# Patient Record
Sex: Male | Born: 1985
Health system: Southern US, Community
[De-identification: ages and names within clinical notes are randomized; demographics above are authoritative.]

## PROBLEM LIST (undated history)

## (undated) DIAGNOSIS — K589 Irritable bowel syndrome without diarrhea: Secondary | ICD-10-CM

## (undated) HISTORY — DX: Essential (primary) hypertension: I10

---

## 2014-11-06 ENCOUNTER — Encounter (HOSPITAL_COMMUNITY): Payer: Self-pay

## 2014-11-06 ENCOUNTER — Emergency Department (HOSPITAL_COMMUNITY)
Admission: EM | Admit: 2014-11-06 | Discharge: 2014-11-06 | Disposition: A | Discharge status: Home / Self Care | Payer: 59 | Attending: Physician Assistant | Admitting: Physician Assistant

## 2014-11-06 ENCOUNTER — Emergency Department (HOSPITAL_COMMUNITY): Payer: 59

## 2014-11-06 DIAGNOSIS — Z8719 Personal history of other diseases of the digestive system: Secondary | ICD-10-CM | POA: Diagnosis not present

## 2014-11-06 DIAGNOSIS — R110 Nausea: Secondary | ICD-10-CM | POA: Diagnosis not present

## 2014-11-06 DIAGNOSIS — Z87891 Personal history of nicotine dependence: Secondary | ICD-10-CM | POA: Insufficient documentation

## 2014-11-06 DIAGNOSIS — R1013 Epigastric pain: Secondary | ICD-10-CM | POA: Diagnosis present

## 2014-11-06 DIAGNOSIS — R1011 Right upper quadrant pain: Secondary | ICD-10-CM | POA: Insufficient documentation

## 2014-11-06 DIAGNOSIS — Z79899 Other long term (current) drug therapy: Secondary | ICD-10-CM | POA: Insufficient documentation

## 2014-11-06 HISTORY — DX: Irritable bowel syndrome, unspecified: K58.9

## 2014-11-06 LAB — COMPREHENSIVE METABOLIC PANEL
ALT: 19 U/L (ref 17–63)
ANION GAP: 8 (ref 5–15)
AST: 17 U/L (ref 15–41)
Albumin: 5 g/dL (ref 3.5–5.0)
Alkaline Phosphatase: 71 U/L (ref 38–126)
BUN: 14 mg/dL (ref 6–20)
CHLORIDE: 107 mmol/L (ref 101–111)
CO2: 27 mmol/L (ref 22–32)
Calcium: 9.6 mg/dL (ref 8.9–10.3)
Creatinine, Ser: 0.89 mg/dL (ref 0.61–1.24)
GFR calc Af Amer: >60 mL/min (ref >60)
Glucose, Bld: 97 mg/dL (ref 65–99)
POTASSIUM: 4 mmol/L (ref 3.5–5.1)
Sodium: 142 mmol/L (ref 135–145)
Total Bilirubin: 1.2 mg/dL (ref 0.3–1.2)
Total Protein: 7.7 g/dL (ref 6.5–8.1)

## 2014-11-06 LAB — URINALYSIS, ROUTINE W REFLEX MICROSCOPIC
Bilirubin Urine: NEGATIVE
Glucose, UA: NEGATIVE mg/dL
Hgb urine dipstick: NEGATIVE
Ketones, ur: NEGATIVE mg/dL
LEUKOCYTES UA: NEGATIVE
NITRITE: NEGATIVE
PH: 8 (ref 5.0–8.0)
Protein, ur: NEGATIVE mg/dL
SPECIFIC GRAVITY, URINE: 1.023 (ref 1.005–1.030)
Urobilinogen, UA: 0.2 mg/dL (ref 0.0–1.0)

## 2014-11-06 LAB — CBC
HEMATOCRIT: 38.7 % — AB (ref 39.0–52.0)
HEMOGLOBIN: 13.2 g/dL (ref 13.0–17.0)
MCH: 29.9 pg (ref 26.0–34.0)
MCHC: 34.1 g/dL (ref 30.0–36.0)
MCV: 87.6 fL (ref 78.0–100.0)
Platelets: 243 10*3/uL (ref 150–400)
RBC: 4.42 MIL/uL (ref 4.22–5.81)
RDW: 12.6 % (ref 11.5–15.5)
WBC: 5.8 10*3/uL (ref 4.0–10.5)

## 2014-11-06 LAB — LIPASE, BLOOD: LIPASE: 23 U/L (ref 22–51)

## 2014-11-06 MED ORDER — PANTOPRAZOLE SODIUM 40 MG IV SOLR
40.0000 mg | INTRAVENOUS | Status: AC
  Administered 2014-11-06: 40 mg via INTRAVENOUS
  Filled 2014-11-06: qty 40

## 2014-11-06 MED ORDER — ONDANSETRON HCL 4 MG/2ML IJ SOLN
4.0000 mg | Freq: Once | INTRAMUSCULAR | Status: AC
  Administered 2014-11-06: 4 mg via INTRAVENOUS
  Filled 2014-11-06: qty 2

## 2014-11-06 MED ORDER — SUCRALFATE 1 GM/10ML PO SUSP: 1.0000 g | Freq: Three times a day (TID) | ORAL | Status: DC

## 2014-11-06 MED ORDER — MORPHINE SULFATE (PF) 4 MG/ML IV SOLN
4.0000 mg | Freq: Once | INTRAVENOUS | Status: AC
  Administered 2014-11-06: 4 mg via INTRAVENOUS
  Filled 2014-11-06: qty 1

## 2014-11-06 NOTE — ED Provider Notes (Signed)
CSN: [id number]     Arrival date & time 11/06/14  1525 History   First MD Initiated Contact with Patient 11/06/14 1718     Chief Complaint  Patient presents with  . Abdominal Pain     (Consider location/radiation/quality/duration/timing/severity/associated sxs/prior Treatment) HPI Comments: Patient presents to the emergency department with chief complaint of epigastric abdominal pain 2 weeks. Patient states the pain radiates to his back. Is mostly located in his right upper quadrant. It is worsened with palpation. He denies any association with eating or drinking. He was seen by his PCP, who prescribed omeprazole and tested him for H. pylori. These studies have not resulted.  Patient states that the abdominal pain worsened last night. He reports associated nausea, but no vomiting. He states that he does have a history of IBS, but states that this feels different. He denies any bowel or bladder changes.  The history is provided by the patient. No language interpreter was used.    Past Medical History  Diagnosis Date  . Irritable bowel syndrome (IBS)    History reviewed. No pertinent past surgical history. History reviewed. No pertinent family history. Social History  Substance Use Topics  . Smoking status: Former Research scientist (life sciences)  . Smokeless tobacco: None  . Alcohol Use: No    Review of Systems  Constitutional: Negative for fever and chills.  Respiratory: Negative for shortness of breath.   Cardiovascular: Negative for chest pain.  Gastrointestinal: Positive for nausea and abdominal pain. Negative for vomiting, diarrhea and constipation.  Genitourinary: Negative for dysuria.  All other systems reviewed and are negative.     Allergies  Review of patient's allergies indicates no known allergies.  Home Medications   Prior to Admission medications   Medication Sig Start Date End Date Taking? Authorizing Provider  loperamide (IMODIUM) 2 MG capsule Take 2 mg by mouth daily.   Yes  Historical Provider, MD  omeprazole (PRILOSEC) 20 MG capsule Take 20 mg by mouth 2 (two) times daily. 11/03/14  Yes Historical Provider, MD   BP 122/88 mmHg  Pulse 77  Temp(Src) 98.3 F (36.8 C) (Oral)  Resp 18  SpO2 100% Physical Exam  Constitutional: He is oriented to person, place, and time. He appears well-developed and well-nourished.  HENT:  Head: Normocephalic and atraumatic.  Eyes: Conjunctivae and EOM are normal. Pupils are equal, round, and reactive to light. Right eye exhibits no discharge. Left eye exhibits no discharge. No scleral icterus.  Neck: Normal range of motion. Neck supple. No JVD present.  Cardiovascular: Normal rate, regular rhythm and normal heart sounds.  Exam reveals no gallop and no friction rub.   No murmur heard. Pulmonary/Chest: Effort normal and breath sounds normal. No respiratory distress. He has no wheezes. He has no rales. He exhibits no tenderness.  Abdominal: Soft. He exhibits no distension and no mass. There is tenderness. There is no rebound and no guarding.  Right upper quadrant tenderness palpation, no other focal abdominal tenderness  Musculoskeletal: Normal range of motion. He exhibits no edema or tenderness.  Neurological: He is alert and oriented to person, place, and time.  Skin: Skin is warm and dry.  Psychiatric: He has a normal mood and affect. His behavior is normal. Judgment and thought content normal.  Nursing note and vitals reviewed.   ED Course  Procedures (including critical care time) Results for orders placed or performed during the hospital encounter of 11/06/14  Lipase, blood  Result Value Ref Range   Lipase 23 22 - 51 U/L  Comprehensive metabolic panel  Result Value Ref Range   Sodium 142 135 - 145 mmol/L   Potassium 4.0 3.5 - 5.1 mmol/L   Chloride 107 101 - 111 mmol/L   CO2 27 22 - 32 mmol/L   Glucose, Bld 97 65 - 99 mg/dL   BUN 14 6 - 20 mg/dL   Creatinine, Ser 0.89 0.61 - 1.24 mg/dL   Calcium 9.6 8.9 - 10.3  mg/dL   Total Protein 7.7 6.5 - 8.1 g/dL   Albumin 5.0 3.5 - 5.0 g/dL   AST 17 15 - 41 U/L   ALT 19 17 - 63 U/L   Alkaline Phosphatase 71 38 - 126 U/L   Total Bilirubin 1.2 0.3 - 1.2 mg/dL   GFR calc non Af Amer >60 >60 mL/min   GFR calc Af Amer >60 >60 mL/min   Anion gap 8 5 - 15  CBC  Result Value Ref Range   WBC 5.8 4.0 - 10.5 K/uL   RBC 4.42 4.22 - 5.81 MIL/uL   Hemoglobin 13.2 13.0 - 17.0 g/dL   HCT 38.7 (L) 39.0 - 52.0 %   MCV 87.6 78.0 - 100.0 fL   MCH 29.9 26.0 - 34.0 pg   MCHC 34.1 30.0 - 36.0 g/dL   RDW 12.6 11.5 - 15.5 %   Platelets 243 150 - 400 K/uL  Urinalysis, Routine w reflex microscopic (not at [facility])  Result Value Ref Range   Color, Urine YELLOW YELLOW   APPearance CLOUDY (A) CLEAR   Specific Gravity, Urine 1.023 1.005 - 1.030   pH 8.0 5.0 - 8.0   Glucose, UA NEGATIVE NEGATIVE mg/dL   Hgb urine dipstick NEGATIVE NEGATIVE   Bilirubin Urine NEGATIVE NEGATIVE   Ketones, ur NEGATIVE NEGATIVE mg/dL   Protein, ur NEGATIVE NEGATIVE mg/dL   Urobilinogen, UA 0.2 0.0 - 1.0 mg/dL   Nitrite NEGATIVE NEGATIVE   Leukocytes, UA NEGATIVE NEGATIVE   US Abdomen Limited  11/06/2014   CLINICAL DATA:  29 year old male with right upper quadrant pain radiating to the back for 2 weeks. Initial encounter.  EXAM: US ABDOMEN LIMITED - RIGHT UPPER QUADRANT  COMPARISON:  None.  FINDINGS: Gallbladder:  No gallstones or wall thickening visualized. No sonographic Murphy sign noted.  Common bile duct:  Diameter: 2 mm, normal  Liver:  Diffusely increased echogenicity (image 48). No intrahepatic biliary ductal dilatation or discrete liver lesion.  Other findings: Negative visible right kidney.  IMPRESSION: Fatty liver disease.  Negative gallbladder.   Electronically Signed   By: Genevie Ann M.D.   On: 11/06/2014 18:40     Imaging Review No results found. I have personally reviewed and evaluated these images and lab results as part of my medical decision-making.   EKG Interpretation None       MDM   Final diagnoses:  Epigastric abdominal pain  RUQ abdominal pain    Patient with right upper quadrant abdominal pain and epigastric abdominal pain for the past 2 weeks. Labs are fairly reassuring, there is no elevation of LFTs, lipase is normal, CBC and electrolytes are normal. Given the amount of pain the patient is experiencing with palpation right upper quadrant, will order right upper quadrant ultrasound. It is possible the patient may also have a stomach ulcer. Will give the patient some Protonix and will reassess.  Right upper quadrant ultrasound is negative. Patient does not have any lower abdominal pain. His labs are reassuring. I doubt acute emergent process. Patient states that he has been very  stressed, I suspect that he might have an ulcer. He did have some relief with the Protonix. I'll discharge him with carafate, he has omeprazole at home. Specific return precautions given. Patient understands and agrees to plan. He is stable and ready for discharge.    Montine Circle, PA-C 11/06/14 2342  Courteney Julio Alm, MD 11/06/14 [phone]

## 2014-11-06 NOTE — Discharge Instructions (Signed)

## 2014-11-06 NOTE — ED Notes (Signed)
Pt with abdominal pain x 2 weeks.  Radiating to back.  MD placed pt on prilosec and ? h-pylori.  Pt had test and waiting result.  Abdominal pain worsened last night.  Felt he was going to pass out.  Nausea with no vomiting.  Belching. No fever. Normal bm's with his IBS

## 2014-11-09 ENCOUNTER — Other Ambulatory Visit: Payer: Self-pay | Admitting: Gastroenterology

## 2014-11-09 DIAGNOSIS — R1013 Epigastric pain: Secondary | ICD-10-CM

## 2014-11-09 DIAGNOSIS — R110 Nausea: Secondary | ICD-10-CM

## 2014-11-13 ENCOUNTER — Encounter (HOSPITAL_COMMUNITY)
Admission: RE | Admit: 2014-11-13 | Discharge: 2014-11-13 | Disposition: A | Discharge status: Home / Self Care | Payer: 59 | Source: Ambulatory Visit | Attending: Gastroenterology | Admitting: Gastroenterology

## 2014-11-13 DIAGNOSIS — R110 Nausea: Secondary | ICD-10-CM | POA: Diagnosis present

## 2014-11-13 DIAGNOSIS — R1013 Epigastric pain: Secondary | ICD-10-CM | POA: Insufficient documentation

## 2014-11-13 MED ORDER — SINCALIDE 5 MCG IJ SOLR
INTRAMUSCULAR | Status: AC
  Administered 2014-11-13: 1.91 ug via INTRAVENOUS
  Filled 2014-11-13: qty 5

## 2014-11-13 MED ORDER — SINCALIDE 5 MCG IJ SOLR
0.0200 ug/kg | Freq: Once | INTRAMUSCULAR | Status: AC
  Administered 2014-11-13: 1.91 ug via INTRAVENOUS

## 2014-11-13 MED ORDER — TECHNETIUM TC 99M MEBROFENIN IV KIT: 5.0000 | PACK | Freq: Once | INTRAVENOUS | Status: DC | PRN

## 2014-11-13 MED ORDER — STERILE WATER FOR INJECTION IJ SOLN
INTRAMUSCULAR | Status: AC
  Administered 2014-11-13: 5 mL
  Filled 2014-11-13: qty 10

## 2014-11-15 ENCOUNTER — Other Ambulatory Visit: Payer: Self-pay

## 2014-11-16 ENCOUNTER — Other Ambulatory Visit: Payer: Self-pay | Admitting: Surgery

## 2014-11-16 DIAGNOSIS — R1011 Right upper quadrant pain: Secondary | ICD-10-CM

## 2014-11-16 DIAGNOSIS — K829 Disease of gallbladder, unspecified: Secondary | ICD-10-CM

## 2014-11-18 ENCOUNTER — Other Ambulatory Visit (HOSPITAL_COMMUNITY): Payer: 59

## 2014-11-22 ENCOUNTER — Ambulatory Visit (HOSPITAL_COMMUNITY): Admission: RE | Admit: 2014-11-22 | Payer: 59 | Source: Ambulatory Visit

## 2014-11-23 ENCOUNTER — Ambulatory Visit
Admission: RE | Admit: 2014-11-23 | Discharge: 2014-11-23 | Disposition: A | Discharge status: Home / Self Care | Payer: 59 | Source: Ambulatory Visit | Attending: Surgery | Admitting: Surgery

## 2014-11-23 MED ORDER — IOPAMIDOL (ISOVUE-300) INJECTION 61%
125.0000 mL | Freq: Once | INTRAVENOUS | Status: AC | PRN
  Administered 2014-11-23: 125 mL via INTRAVENOUS

## 2016-04-01 DIAGNOSIS — J060 Acute laryngopharyngitis: Secondary | ICD-10-CM | POA: Diagnosis not present

## 2016-04-02 DIAGNOSIS — R194 Change in bowel habit: Secondary | ICD-10-CM | POA: Diagnosis not present

## 2016-04-02 DIAGNOSIS — K635 Polyp of colon: Secondary | ICD-10-CM | POA: Diagnosis not present

## 2016-04-02 DIAGNOSIS — Z1211 Encounter for screening for malignant neoplasm of colon: Secondary | ICD-10-CM | POA: Diagnosis not present

## 2016-04-02 DIAGNOSIS — D128 Benign neoplasm of rectum: Secondary | ICD-10-CM | POA: Diagnosis not present

## 2016-10-30 IMAGING — NM NM HEPATO W/GB/PHARM/[PERSON_NAME]
3 series · 13 of 13 positions shown · non-contrast
Comparison: None.

CLINICAL DATA: Epigastric abdominal pain.  Nausea.

EXAM:
NUCLEAR MEDICINE HEPATOBILIARY IMAGING WITH GALLBLADDER EF
TECHNIQUE: Sequential images of the abdomen were obtained [DATE] minutes
following intravenous administration of radiopharmaceutical. After
slow intravenous infusion of 1.91 micrograms Cholecystokinin,
gallbladder ejection fraction was determined.
RADIOPHARMACEUTICALS:  5.0 mCi Qc-VVm Choletec IV

[Series 1: biliary · 4.14mm/px · 6 of 60 frames shown]
[frame 6/60]
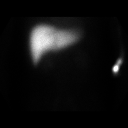
[frame 16/60]
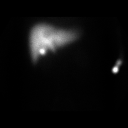
[frame 26/60]
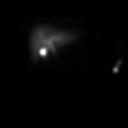
[frame 36/60]
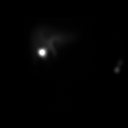
[frame 46/60]
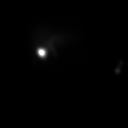
[frame 56/60]
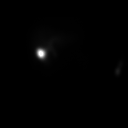

[Series 2: rt lat · 4.14mm/px · 1 of 1 slices shown]
[im 1/1]
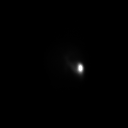

[Series 3: gbef · 4.14mm/px · 6 of 60 frames shown]
[frame 6/60]
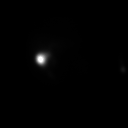
[frame 16/60]
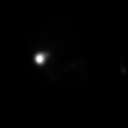
[frame 26/60]
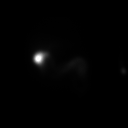
[frame 36/60]
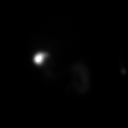
[frame 46/60]
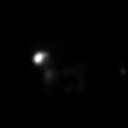
[frame 56/60]
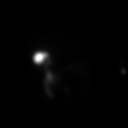

[13 of 13 positions shown; findings below may reference images not displayed]

FINDINGS: There is immediate homogeneous uptake of radiotracer in the liver.
Filling of the gallbladder begins at 15 minutes. Radiotracer uptake
is present in the small bowel at greater than 60 minutes following
CCK injection.

At the end of 1-hour, there is relatively good clearance of activity
from the liver.

When gallbladder filling was complete, the patient was given an IV
infusion of CCK. The gallbladder ejection fraction measures 40%. At
45 min, normal ejection fraction is greater than 40%.
IMPRESSION: 1. Normal hepatobiliary scan.
2. Lower end of normal gallbladder ejection fraction.

## 2017-02-12 IMAGING — US US ABDOMEN LIMITED
1 series · 14 of 25 positions shown · non-contrast
Comparison: None.

CLINICAL DATA: 29-year-old male with right upper quadrant pain
radiating to the back for 2 weeks. Initial encounter.

EXAM:
US ABDOMEN LIMITED - RIGHT UPPER QUADRANT

[Series 1: us abdomen limited · 0.20mm/px · 14 of 50 slices shown]
[im 1/50]
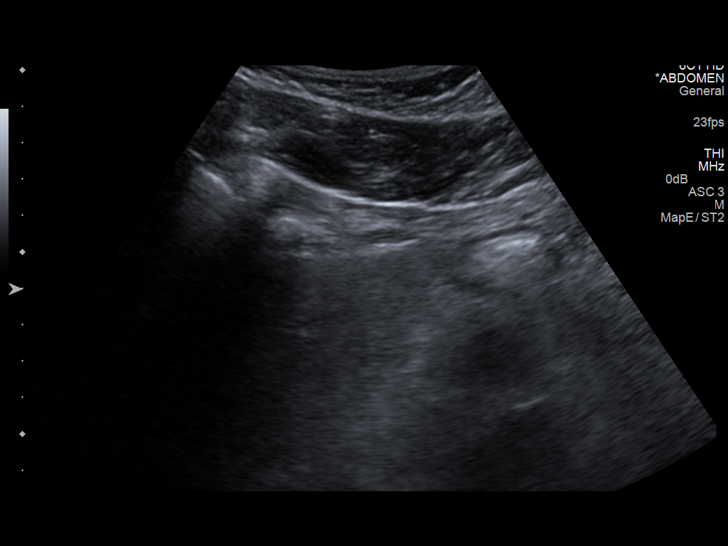
[im 5/50]
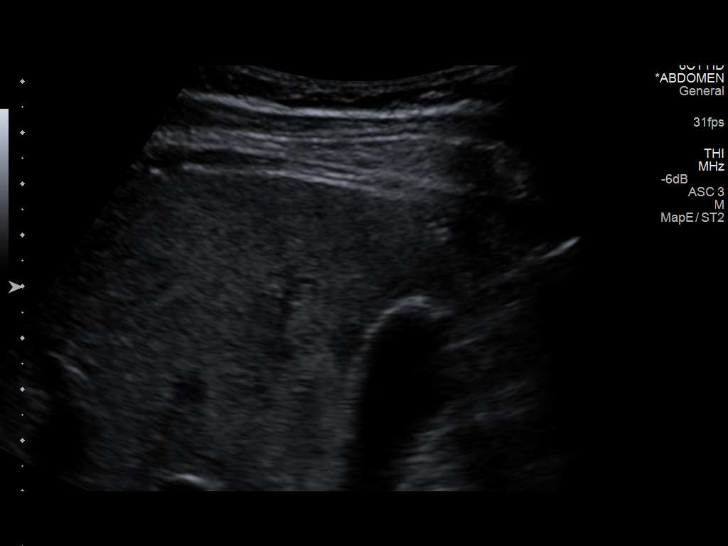
[im 9/50]
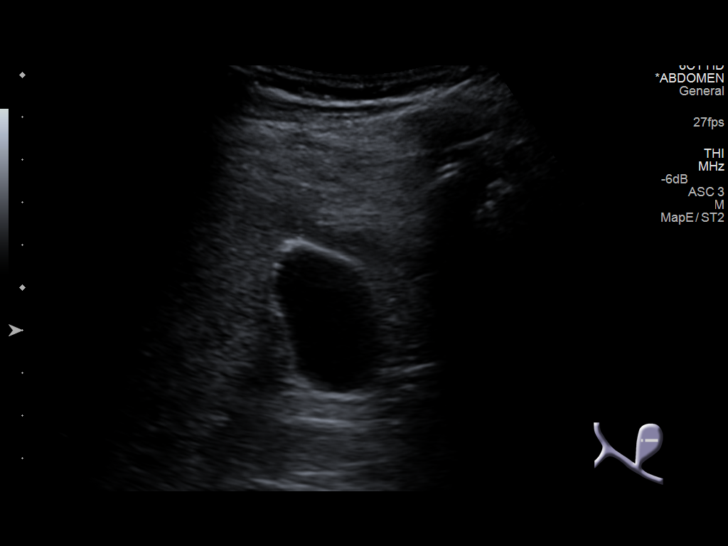
[im 13/50]
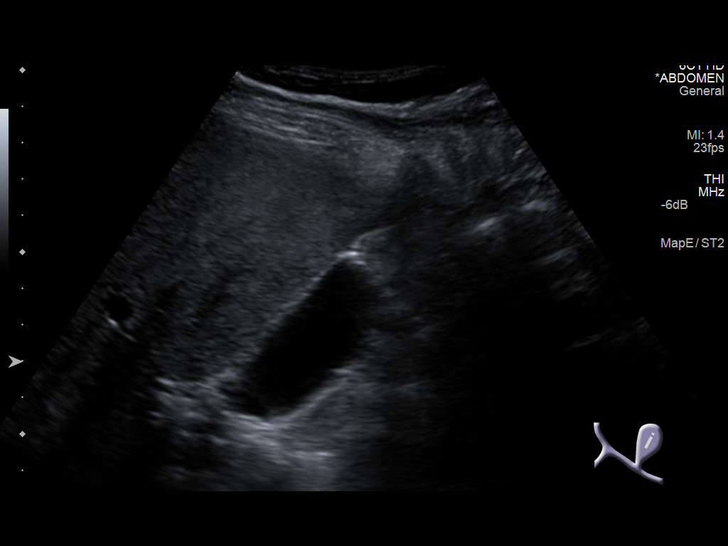
[im 17/50]
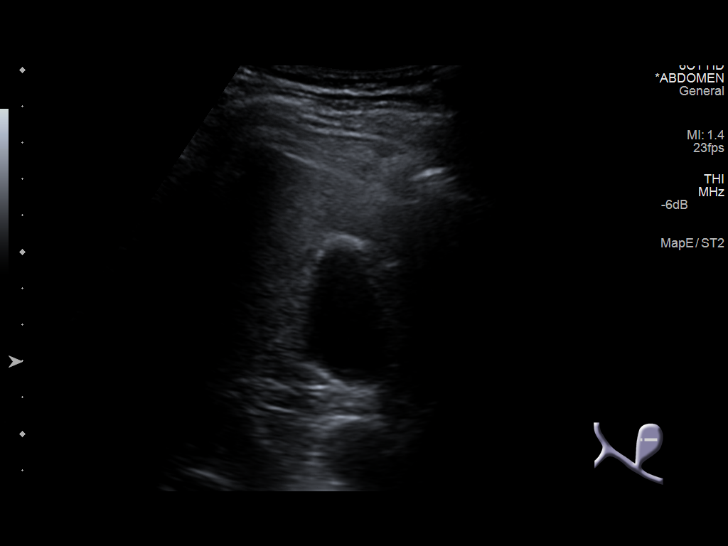
[im 19/50]
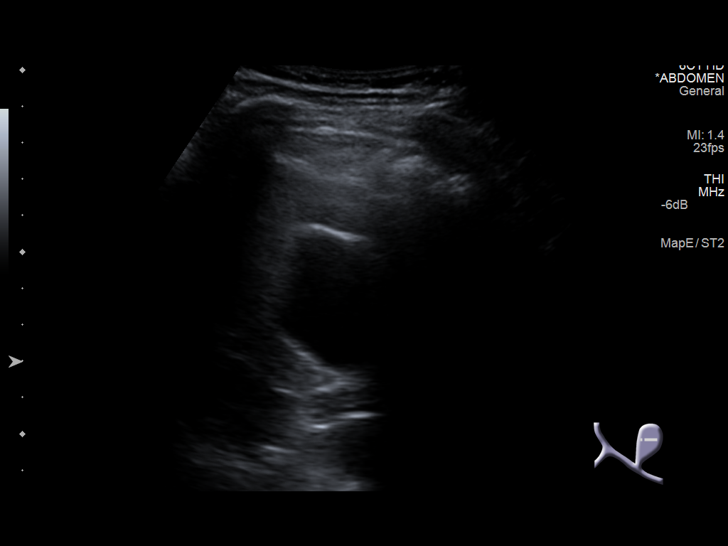
[im 23/50]
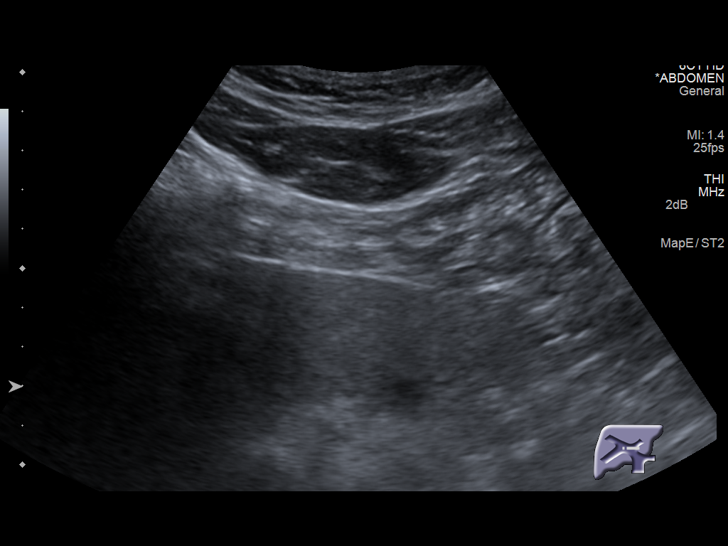
[im 27/50]
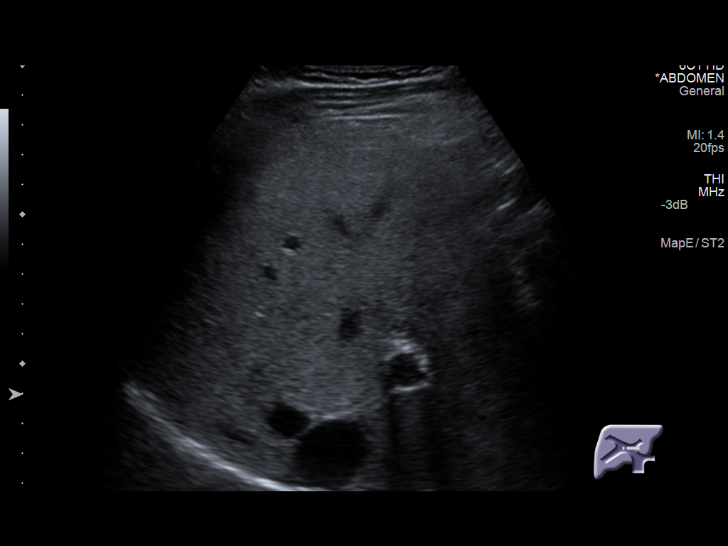
[im 31/50]
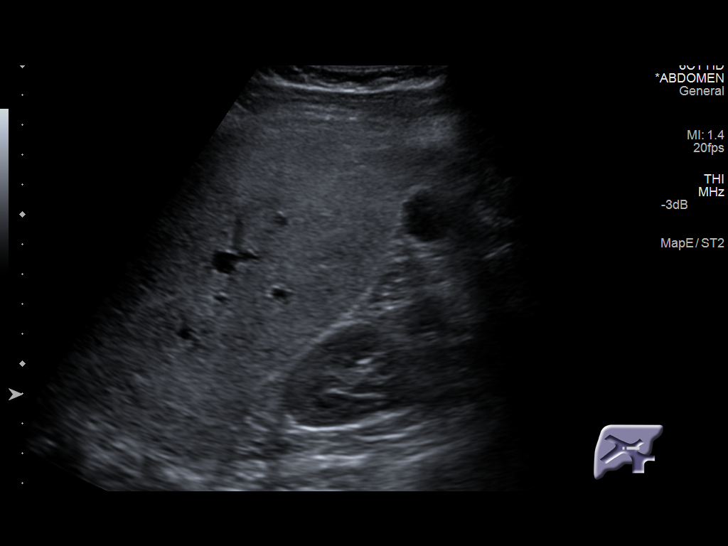
[im 33/50]
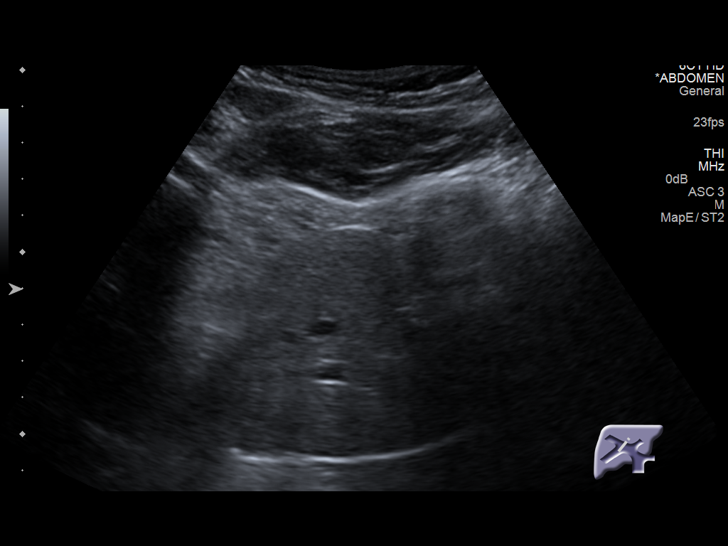
[im 37/50]
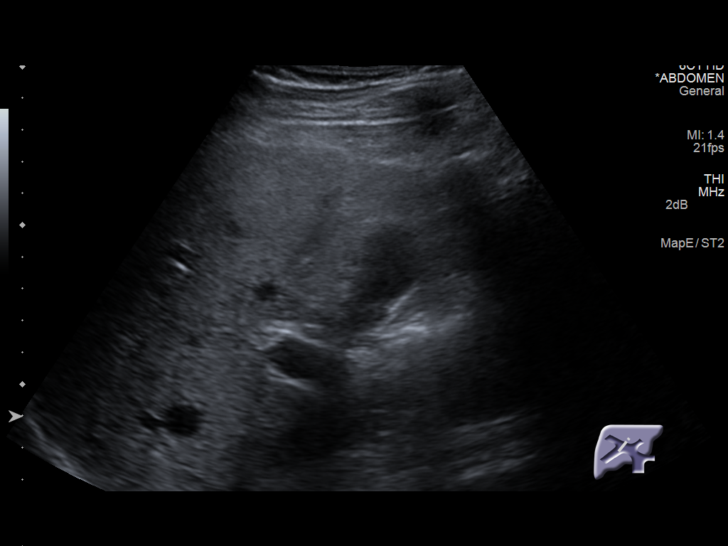
[im 41/50]
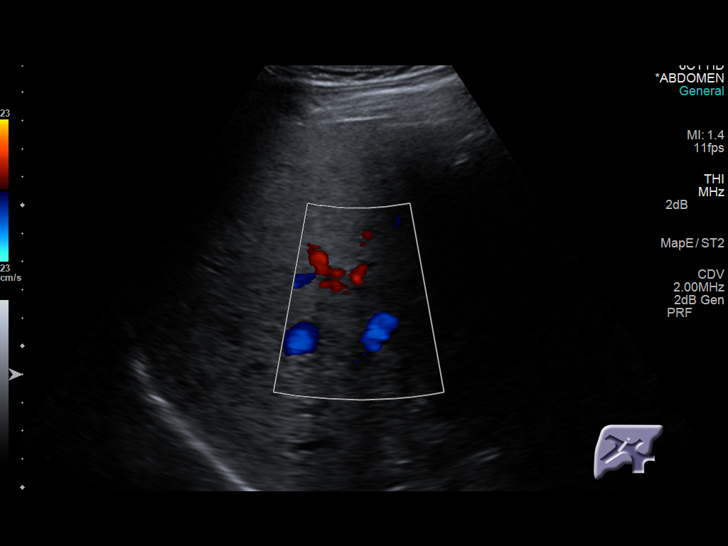
[im 45/50]
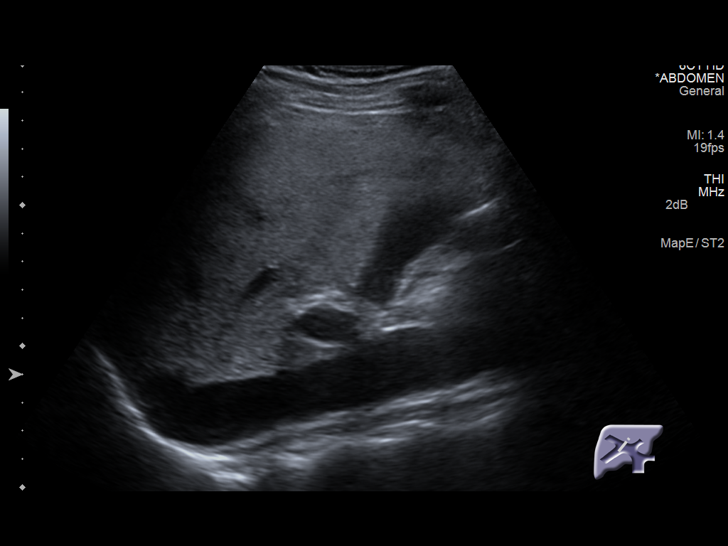
[im 50/50]
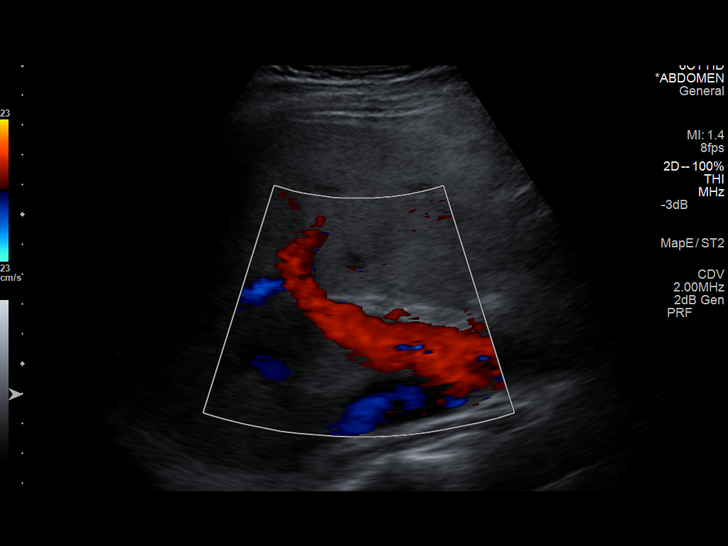

[14 of 25 positions shown; findings below may reference images not displayed]

FINDINGS: Gallbladder:

No gallstones or wall thickening visualized. No sonographic Murphy
sign noted.

Common bile duct:

Diameter: 2 mm, normal

Liver:

Diffusely increased echogenicity (image 48). No intrahepatic biliary
ductal dilatation or discrete liver lesion.

Other findings: Negative visible right kidney.
IMPRESSION: Fatty liver disease.  Negative gallbladder.

## 2017-05-11 DIAGNOSIS — J060 Acute laryngopharyngitis: Secondary | ICD-10-CM | POA: Diagnosis not present

## 2017-09-04 DIAGNOSIS — R1084 Generalized abdominal pain: Secondary | ICD-10-CM | POA: Diagnosis not present

## 2017-09-04 DIAGNOSIS — R3121 Asymptomatic microscopic hematuria: Secondary | ICD-10-CM | POA: Diagnosis not present

## 2017-09-04 DIAGNOSIS — R109 Unspecified abdominal pain: Secondary | ICD-10-CM | POA: Diagnosis not present

## 2017-09-04 DIAGNOSIS — R112 Nausea with vomiting, unspecified: Secondary | ICD-10-CM | POA: Diagnosis not present

## 2017-09-04 DIAGNOSIS — N201 Calculus of ureter: Secondary | ICD-10-CM | POA: Diagnosis not present

## 2017-09-11 DIAGNOSIS — H6092 Unspecified otitis externa, left ear: Secondary | ICD-10-CM | POA: Diagnosis not present

## 2019-10-24 ENCOUNTER — Encounter: Payer: Self-pay | Admitting: Legal Medicine

## 2019-10-24 ENCOUNTER — Telehealth (INDEPENDENT_AMBULATORY_CARE_PROVIDER_SITE_OTHER): Payer: 59 | Admitting: Legal Medicine

## 2019-10-24 VITALS — BP 124/74 | HR 69 | Temp 98.4°F | Ht 72.0 in | Wt 220.0 lb

## 2019-10-24 DIAGNOSIS — J0110 Acute frontal sinusitis, unspecified: Secondary | ICD-10-CM | POA: Diagnosis not present

## 2019-10-24 DIAGNOSIS — J019 Acute sinusitis, unspecified: Secondary | ICD-10-CM | POA: Insufficient documentation

## 2019-10-24 DIAGNOSIS — J0100 Acute maxillary sinusitis, unspecified: Secondary | ICD-10-CM | POA: Diagnosis not present

## 2019-10-24 MED ORDER — AMOXICILLIN-POT CLAVULANATE 875-125 MG PO TABS: 1.0000 | ORAL_TABLET | Freq: Two times a day (BID) | ORAL | 0 refills | Status: DC

## 2019-10-24 MED ORDER — PREDNISONE 10 MG (21) PO TBPK: ORAL_TABLET | ORAL | 0 refills | Status: DC

## 2019-10-24 NOTE — Progress Notes (Signed)
Virtual Visit via Telephone Note   This visit type was conducted due to national recommendations for restrictions regarding the COVID-19 Pandemic (e.g. social distancing) in an effort to limit this patient's exposure and mitigate transmission in our community.  Due to his co-morbid illnesses, this patient is at least at moderate risk for complications without adequate follow up.  This format is felt to be most appropriate for this patient at this time.  The patient did not have access to video technology/had technical difficulties with video requiring transitioning to audio format only (telephone).  All issues noted in this document were discussed and addressed.  No physical exam could be performed with this format.  Patient verbally consented to a telehealth visit.   Date:  10/24/2019   ID:  Cody Watson, DOB 27-Jul-1985, MRN [id number]  Patient Location: Home Provider Location: Office/Clinic  PCP:  Marge Duncans, PA-C   Evaluation Performed:  New Patient Evaluation  Chief Complaint:  1 week sinus pressure around eyes.  No fever or chills, congested.    History of Present Illness:    Cody Watson is a 34 y.o. male with 1 week of frontal sinus pressure and runny nose.  No cough or fever or hills.  No covid exposure.  The patient does not have symptoms concerning for COVID-19 infection (fever, chills, cough, or new shortness of breath).    Past Medical History:  Diagnosis Date  . Irritable bowel syndrome (IBS)     History reviewed. No pertinent surgical history.  History reviewed. No pertinent family history.  Social History   Socioeconomic History  . Marital status: Married    Spouse name: Not on file  . Number of children: Not on file  . Years of education: Not on file  . Highest education level: Not on file  Occupational History  . Not on file  Tobacco Use  . Smoking status: Former Research scientist (life sciences)  . Smokeless tobacco: Never Used  Substance and Sexual Activity  . Alcohol use:  No  . Drug use: No  . Sexual activity: Not on file  Other Topics Concern  . Not on file  Social History Narrative  . Not on file   Social Determinants of Health   Financial Resource Strain:   . Difficulty of Paying Living Expenses:   Food Insecurity:   . Worried About Charity fundraiser in the Last Year:   . Arboriculturist in the Last Year:   Transportation Needs:   . Film/video editor (Medical):   Marland Kitchen Lack of Transportation (Non-Medical):   Physical Activity:   . Days of Exercise per Week:   . Minutes of Exercise per Session:   Stress:   . Feeling of Stress :   Social Connections:   . Frequency of Communication with Friends and Family:   . Frequency of Social Gatherings with Friends and Family:   . Attends Religious Services:   . Active Member of Clubs or Organizations:   . Attends Archivist Meetings:   Marland Kitchen Marital Status:   Intimate Partner Violence:   . Fear of Current or Ex-Partner:   . Emotionally Abused:   Marland Kitchen Physically Abused:   . Sexually Abused:     Outpatient Medications Prior to Visit  Medication Sig Dispense Refill  . aspirin EC 81 MG tablet Take 81 mg by mouth daily. Swallow whole.    . loratadine (CLARITIN) 10 MG tablet Take 10 mg by mouth daily.    Marland Kitchen loperamide (IMODIUM)  2 MG capsule Take 2 mg by mouth daily.    Marland Kitchen omeprazole (PRILOSEC) 20 MG capsule Take 20 mg by mouth 2 (two) times daily.    . sucralfate (CARAFATE) 1 GM/10ML suspension Take 10 mLs (1 g total) by mouth 4 (four) times daily -  with meals and at bedtime. 420 mL 0   No facility-administered medications prior to visit.   .med Allergies:   Patient has no known allergies.   Social History   Tobacco Use  . Smoking status: Former Research scientist (life sciences)  . Smokeless tobacco: Never Used  Substance Use Topics  . Alcohol use: No  . Drug use: No     Review of Systems  Constitutional: Negative.   HENT: Positive for congestion and sinus pain.   Eyes: Negative.   Respiratory: Negative.     Cardiovascular: Negative.   Gastrointestinal: Negative.   Genitourinary: Negative.   Musculoskeletal: Negative.   Neurological: Negative.   Psychiatric/Behavioral: Negative.      Labs/Other Tests and Data Reviewed:    Recent Labs: No results found for requested labs within last 8760 hours.   Recent Lipid Panel No results found for: CHOL, TRIG, HDL, CHOLHDL, LDLCALC, LDLDIRECT  Wt Readings from Last 3 Encounters:  10/24/19 220 lb (99.8 kg)     Objective:    Vital Signs:  BP 124/74   Pulse 69   Temp 98.4 F (36.9 C)   Ht 6' (1.829 m)   Wt 220 lb (99.8 kg)   SpO2 97%   BMI 29.84 kg/m    Physical Exam VS reviewed  ASSESSMENT & PLAN:   Diagnoses and all orders for this visit: Acute non-recurrent frontal sinusitis -     amoxicillin-clavulanate (AUGMENTIN) 875-125 MG tablet; Take 1 tablet by mouth 2 (two) times daily. -     predniSONE (STERAPRED UNI-PAK 21 TAB) 10 MG (21) TBPK tablet; Take 6ills first day , then 5 pills day 2 and then cut down one pill day until gone Acute non-recurrent maxillary sinusitis Patient needs augmentin and prednisone pack.       COVID-19 Education: The signs and symptoms of COVID-19 were discussed with the patient and how to seek care for testing (follow up with PCP or arrange E-visit). The importance of social distancing was discussed today.  Time:   Today, I have spent 20 minutes with the patient with telehealth technology discussing the above problems.    Follow Up:  In Person prn  Signed, Reinaldo Meeker, MD  10/24/2019 11:31 AM    [place]

## 2019-11-09 ENCOUNTER — Encounter: Payer: Self-pay | Admitting: Physician Assistant

## 2019-11-09 ENCOUNTER — Ambulatory Visit (INDEPENDENT_AMBULATORY_CARE_PROVIDER_SITE_OTHER): Payer: 59 | Admitting: Physician Assistant

## 2019-11-09 ENCOUNTER — Other Ambulatory Visit: Payer: Self-pay

## 2019-11-09 ENCOUNTER — Other Ambulatory Visit: Payer: Self-pay | Admitting: Physician Assistant

## 2019-11-09 VITALS — BP 110/62 | HR 78 | Temp 97.8°F | Ht 72.5 in | Wt 222.8 lb

## 2019-11-09 DIAGNOSIS — Z0000 Encounter for general adult medical examination without abnormal findings: Secondary | ICD-10-CM

## 2019-11-09 NOTE — Patient Instructions (Signed)
Preventive Care 19-36 Years Old, Male Preventive care refers to lifestyle choices and visits with your health care provider that can promote health and wellness. This includes:  A yearly physical exam. This is also called an annual well check.  Regular dental and eye exams.  Immunizations.  Screening for certain conditions.  Healthy lifestyle choices, such as eating a healthy diet, getting regular exercise, not using drugs or products that contain nicotine and tobacco, and limiting alcohol use. What can I expect for my preventive care visit? Physical exam Your health care provider will check:  Height and weight. These may be used to calculate body mass index (BMI), which is a measurement that tells if you are at a healthy weight.  Heart rate and blood pressure.  Your skin for abnormal spots. Counseling Your health care provider may ask you questions about:  Alcohol, tobacco, and drug use.  Emotional well-being.  Home and relationship well-being.  Sexual activity.  Eating habits.  Work and work Statistician. What immunizations do I need?  Influenza (flu) vaccine  This is recommended every year. Tetanus, diphtheria, and pertussis (Tdap) vaccine  You may need a Td booster every 10 years. Varicella (chickenpox) vaccine  You may need this vaccine if you have not already been vaccinated. Human papillomavirus (HPV) vaccine  If recommended by your health care provider, you may need three doses over 6 months. Measles, mumps, and rubella (MMR) vaccine  You may need at least one dose of MMR. You may also need a second dose. Meningococcal conjugate (MenACWY) vaccine  One dose is recommended if you are 45-76 years old and a Market researcher living in a residence hall, or if you have one of several medical conditions. You may also need additional booster doses. Pneumococcal conjugate (PCV13) vaccine  You may need this if you have certain conditions and were not  previously vaccinated. Pneumococcal polysaccharide (PPSV23) vaccine  You may need one or two doses if you smoke cigarettes or if you have certain conditions. Hepatitis A vaccine  You may need this if you have certain conditions or if you travel or work in places where you may be exposed to hepatitis A. Hepatitis B vaccine  You may need this if you have certain conditions or if you travel or work in places where you may be exposed to hepatitis B. Haemophilus influenzae type b (Hib) vaccine  You may need this if you have certain risk factors. You may receive vaccines as individual doses or as more than one vaccine together in one shot (combination vaccines). Talk with your health care provider about the risks and benefits of combination vaccines. What tests do I need? Blood tests  Lipid and cholesterol levels. These may be checked every 5 years starting at age 17.  Hepatitis C test.  Hepatitis B test. Screening   Diabetes screening. This is done by checking your blood sugar (glucose) after you have not eaten for a while (fasting).  Sexually transmitted disease (STD) testing. Talk with your health care provider about your test results, treatment options, and if necessary, the need for more tests. Follow these instructions at home: Eating and drinking   Eat a diet that includes fresh fruits and vegetables, whole grains, lean protein, and low-fat dairy products.  Take vitamin and mineral supplements as recommended by your health care provider.  Do not drink alcohol if your health care provider tells you not to drink.  If you drink alcohol: ? Limit how much you have to 0-2  drinks a day. ? Be aware of how much alcohol is in your drink. In the [place] one drink equals one 12 oz bottle of beer (355 mL), one 5 oz glass of wine (148 mL), or one 1 oz glass of hard liquor (44 mL). Lifestyle  Take daily care of your teeth and gums.  Stay active. Exercise for at least 30 minutes on 5 or  more days each week.  Do not use any products that contain nicotine or tobacco, such as cigarettes, e-cigarettes, and chewing tobacco. If you need help quitting, ask your health care provider.  If you are sexually active, practice safe sex. Use a condom or other form of protection to prevent STIs (sexually transmitted infections). What's next?  Go to your health care provider once a year for a well check visit.  Ask your health care provider how often you should have your eyes and teeth checked.  Stay up to date on all vaccines. This information is not intended to replace advice given to you by your health care provider. Make sure you discuss any questions you have with your health care provider. Document Revised: 02/25/2018 Document Reviewed: 02/25/2018 Elsevier Patient Education  2020 [employer].

## 2019-11-09 NOTE — Progress Notes (Signed)
Subjective:  Patient ID: Cody Watson, male    DOB: Aug 15, 1985  Age: 34 y.o. MRN: [id number]  Chief Complaint  Patient presents with  . Annual Exam    HPI  Well Adult Physical: Patient here for a comprehensive physical exam.The patient reports no problems Do you take any herbs or supplements that were not prescribed by a doctor? no Are you taking calcium supplements? no Are you taking aspirin daily? no  Encounter for general adult medical examination without abnormal findings : Patient's last physical exam was 1 year ago .  Blood Pressure: Normal (BP less than 120/80) ;  Medical History: Patient history reviewed ; Family history reviewed ;  Allergies Reviewed: No change in current allergies ;  Medications Reviewed: Medications reviewed - no changes ;  Lipids: Normal lipid levels ;  Smoking: Life-long non-smoker ;  Physical Activity: Exercises at least 3 times per week ;  Alcohol/Drug Use: social drinks ; No illicit drug use ;  Safety: reviewed ; Patient wears a seat belt, has smoke detectors, has carbon monoxide detectors, practices appropriate gun safety, and wears sunscreen with extended sun exposure. Dental Care: biannual cleanings, brushes and flosses daily. Ophthalmology/Optometry: is due Hearing loss: none Vision impairments: none             Social History   Socioeconomic History  . Marital status: Married    Spouse name: Not on file  . Number of children: Not on file  . Years of education: Not on file  . Highest education level: Not on file  Occupational History  . Not on file  Tobacco Use  . Smoking status: Former Research scientist (life sciences)  . Smokeless tobacco: Never Used  Substance and Sexual Activity  . Alcohol use: No  . Drug use: No  . Sexual activity: Not on file  Other Topics Concern  . Not on file  Social History Narrative  . Not on file   Social Determinants of Health   Financial Resource Strain:   . Difficulty of Paying Living Expenses: Not on file  Food  Insecurity:   . Worried About Charity fundraiser in the Last Year: Not on file  . Ran Out of Food in the Last Year: Not on file  Transportation Needs:   . Lack of Transportation (Medical): Not on file  . Lack of Transportation (Non-Medical): Not on file  Physical Activity:   . Days of Exercise per Week: Not on file  . Minutes of Exercise per Session: Not on file  Stress:   . Feeling of Stress : Not on file  Social Connections:   . Frequency of Communication with Friends and Family: Not on file  . Frequency of Social Gatherings with Friends and Family: Not on file  . Attends Religious Services: Not on file  . Active Member of Clubs or Organizations: Not on file  . Attends Archivist Meetings: Not on file  . Marital Status: Not on file   Past Medical History:  Diagnosis Date  . Irritable bowel syndrome (IBS)    History reviewed. No pertinent surgical history.  History reviewed. No pertinent family history. Social History   Socioeconomic History  . Marital status: Married    Spouse name: Not on file  . Number of children: Not on file  . Years of education: Not on file  . Highest education level: Not on file  Occupational History  . Not on file  Tobacco Use  . Smoking status: Former Research scientist (life sciences)  . Smokeless  tobacco: Never Used  Substance and Sexual Activity  . Alcohol use: No  . Drug use: No  . Sexual activity: Not on file  Other Topics Concern  . Not on file  Social History Narrative  . Not on file   Social Determinants of Health   Financial Resource Strain:   . Difficulty of Paying Living Expenses: Not on file  Food Insecurity:   . Worried About Charity fundraiser in the Last Year: Not on file  . Ran Out of Food in the Last Year: Not on file  Transportation Needs:   . Lack of Transportation (Medical): Not on file  . Lack of Transportation (Non-Medical): Not on file  Physical Activity:   . Days of Exercise per Week: Not on file  . Minutes of Exercise per  Session: Not on file  Stress:   . Feeling of Stress : Not on file  Social Connections:   . Frequency of Communication with Friends and Family: Not on file  . Frequency of Social Gatherings with Friends and Family: Not on file  . Attends Religious Services: Not on file  . Active Member of Clubs or Organizations: Not on file  . Attends Archivist Meetings: Not on file  . Marital Status: Not on file   Review of Systems CONSTITUTIONAL: Negative for chills, fatigue, fever, unintentional weight gain and unintentional weight loss.  E/N/T: Negative for ear pain, nasal congestion and sore throat.  CARDIOVASCULAR: Negative for chest pain, dizziness, palpitations and pedal edema.  RESPIRATORY: Negative for recent cough and dyspnea.  GASTROINTESTINAL: Negative for abdominal pain, acid reflux symptoms, constipation, diarrhea, nausea and vomiting.  MSK: Negative for arthralgias and myalgias.  INTEGUMENTARY: Negative for rash.  NEUROLOGICAL: Negative for dizziness and headaches.  PSYCHIATRIC: Negative for sleep disturbance and to question depression screen.  Negative for depression, negative for anhedonia.       Objective:  BP 110/62 (BP Location: Left Arm, Patient Position: Sitting)   Pulse 78   Temp 97.8 F (36.6 C) (Temporal)   Ht 6' 0.5" (1.842 m)   Wt 222 lb 12.8 oz (101.1 kg)   SpO2 98%   BMI 29.80 kg/m   BP/Weight 11/09/2019 10/24/2019 11/08/2351  Systolic BP 614 431 540  Diastolic BP 62 74 88  Wt. (Lbs) 222.8 220 -  BMI 29.8 29.84 -    Physical Exam PHYSICAL EXAM:   VS: BP 110/62 (BP Location: Left Arm, Patient Position: Sitting)   Pulse 78   Temp 97.8 F (36.6 C) (Temporal)   Ht 6' 0.5" (1.842 m)   Wt 222 lb 12.8 oz (101.1 kg)   SpO2 98%   BMI 29.80 kg/m   GEN: Well nourished, well developed, in no acute distress  HEENT: normal external ears and nose - normal external auditory canals and TMS - hearing grossly normal - normal nasal mucosa and septum - Lips,  Teeth and Gums - normal  Oropharynx - normal mucosa, palate, and posterior pharynx Neck: no JVD or masses - no thyromegaly Cardiac: RRR; no murmurs, rubs, or gallops,no edema - no significant varicosities Respiratory:  normal respiratory rate and pattern with no distress - normal breath sounds with no rales, rhonchi, wheezes or rubs GI: normal bowel sounds, no masses or tenderness MS: no deformity or atrophy  Skin: warm and dry, no rash  Neuro:  Alert and Oriented x 3, Strength and sensation are intact - CN II-Xii grossly intact Psych: euthymic mood, appropriate affect and demeanor  Lab Results  Component Value Date   WBC 5.8 11/06/2014   HGB 13.2 11/06/2014   HCT 38.7 (L) 11/06/2014   PLT 243 11/06/2014   GLUCOSE 97 11/06/2014   ALT 19 11/06/2014   AST 17 11/06/2014   NA 142 11/06/2014   K 4.0 11/06/2014   CL 107 11/06/2014   CREATININE 0.89 11/06/2014   BUN 14 11/06/2014   CO2 27 11/06/2014      Assessment & Plan:  1. Annual physical exam - CBC with Differential/Platelet - Comprehensive metabolic panel - TSH - Lipid panel    Body mass index is 29.8 kg/m.   These are the goals we discussed: Goals   None      This is a list of the screening recommended for you and due dates:  Health Maintenance  Topic Date Due  . Flu Shot  10/16/2019  . Tetanus Vaccine  11/08/2020*  . COVID-19 Vaccine  Discontinued  .  Hepatitis C: One time screening is recommended by Center for Disease Control  (CDC) for  adults born from [birth year] through 1967.   Discontinued  . HIV Screening  Discontinued  *Topic was postponed. The date shown is not the original due date.     AN INDIVIDUALIZED CARE PLAN: was established or reinforced today.   SELF MANAGEMENT: The patient and I together assessed ways to personally work towards obtaining the recommended goals  Support needs The patient and/or family needs were assessed and services were offered if appropriate.  No orders of the defined types  were placed in this encounter.   Follow-up: Return in about 1 year (around 11/08/2020).  An After Visit Summary was printed and given to the patient.  [facility] [phone]

## 2019-11-09 NOTE — Assessment & Plan Note (Signed)
labwork pending Wellness handout given Continue healthy lifestyle

## 2019-11-10 LAB — CBC WITH DIFFERENTIAL/PLATELET
Basophils Absolute: 0 10*3/uL
Basos: 1 %
EOS (ABSOLUTE): 0.1 10*3/uL
Eos: 1 %
Hematocrit: 39 %
Hemoglobin: 13.3 g/dL
Immature Grans (Abs): 0 10*3/uL
Immature Granulocytes: 0 %
Lymphocytes Absolute: 2 10*3/uL
Lymphs: 25 %
MCH: 30.1 pg
MCHC: 34.1 g/dL
MCV: 88 fL
Monocytes Absolute: 0.7 10*3/uL
Monocytes: 9 %
Neutrophils Absolute: 5.2 10*3/uL
Neutrophils: 64 %
Platelets: 289 10*3/uL (ref 150–450)
RBC: 4.42 x10E6/uL
RDW: 12.9 %
WBC: 8 10*3/uL (ref 3.4–10.8)

## 2019-11-10 LAB — LIPID PANEL W/O CHOL/HDL RATIO
Cholesterol, Total: 246 mg/dL
HDL: 35 mg/dL — ABNORMAL LOW (ref >39)
LDL Chol Calc (NIH): 176 mg/dL
Triglycerides: 188 mg/dL
VLDL Cholesterol Cal: 35 mg/dL (ref 5–40)

## 2019-11-10 LAB — COMPREHENSIVE METABOLIC PANEL
ALT: 30 IU/L
AST: 16 IU/L (ref 0–40)
Albumin/Globulin Ratio: 2.1 (ref 1.2–2.2)
Albumin: 4.7 g/dL
Alkaline Phosphatase: 79 IU/L (ref 48–121)
BUN/Creatinine Ratio: 15
BUN: 16 mg/dL
Bilirubin Total: 0.6 mg/dL (ref 0.0–1.2)
CO2: 25 mmol/L (ref 20–29)
Calcium: 9.4 mg/dL
Chloride: 105 mmol/L (ref 96–106)
Creatinine, Ser: 1.07 mg/dL
Globulin, Total: 2.2 g/dL (ref 1.5–4.5)
Glucose: 106 mg/dL — ABNORMAL HIGH (ref 65–99)
Potassium: 4.5 mmol/L (ref 3.5–5.2)
Sodium: 143 mmol/L (ref 134–144)
Total Protein: 6.9 g/dL (ref 6.0–8.5)

## 2019-11-10 LAB — TSH: TSH: 2.55 u[IU]/mL (ref 0.450–4.500)

## 2019-11-10 LAB — CARDIOVASCULAR RISK ASSESSMENT

## 2020-03-21 ENCOUNTER — Encounter: Payer: Self-pay | Admitting: Physician Assistant

## 2020-03-21 ENCOUNTER — Telehealth (INDEPENDENT_AMBULATORY_CARE_PROVIDER_SITE_OTHER): Payer: 59 | Admitting: Physician Assistant

## 2020-03-21 VITALS — BP 140/90 | HR 93 | Temp 98.6°F

## 2020-03-21 DIAGNOSIS — J060 Acute laryngopharyngitis: Secondary | ICD-10-CM | POA: Diagnosis not present

## 2020-03-21 MED ORDER — AZITHROMYCIN 250 MG PO TABS: ORAL_TABLET | ORAL | 0 refills | Status: DC

## 2020-03-21 MED ORDER — PREDNISONE 20 MG PO TABS: ORAL_TABLET | ORAL | 0 refills | Status: AC

## 2020-03-21 NOTE — Progress Notes (Signed)
Virtual Visit via Telephone Note   This visit type was conducted due to national recommendations for restrictions regarding the COVID-19 Pandemic (e.g. social distancing) in an effort to limit this patient's exposure and mitigate transmission in our community.  Due to his co-morbid illnesses, this patient is at least at moderate risk for complications without adequate follow up.  This format is felt to be most appropriate for this patient at this time.  The patient did not have access to video technology/had technical difficulties with video requiring transitioning to audio format only (telephone).  All issues noted in this document were discussed and addressed.  No physical exam could be performed with this format.  Patient verbally consented to a telehealth visit.   Date:  03/21/2020   ID:  Cody Watson, DOB 22-Feb-1986, MRN [id number]  Patient Location: Home Provider Location: Office  PCP:  Marianne Sofia, PA-C    Chief Complaint:  URI/bronchitis  History of Present Illness:    Cody Watson is a 35 y.o. male with URI/bronchitis symptoms - pt states that since Sunday he has had cough, congestion and ear pain - denies sore throat - yesterday had a fever of 101 - some chills   The patient does have symptoms concerning for COVID-19 infection (fever, chills, cough, or new shortness of breath).    Past Medical History:  Diagnosis Date  . Irritable bowel syndrome (IBS)    History reviewed. No pertinent surgical history.   Current Meds  Medication Sig  . azithromycin (ZITHROMAX) 250 MG tablet 2 po day one then 1 po days 2-5  . predniSONE (DELTASONE) 20 MG tablet Take 3 tablets (60 mg total) by mouth daily with breakfast for 3 days, THEN 2 tablets (40 mg total) daily with breakfast for 3 days, THEN 1 tablet (20 mg total) daily with breakfast for 3 days.     Allergies:   Patient has no known allergies.   Social History   Tobacco Use  . Smoking status: Former Games developer  . Smokeless  tobacco: Never Used  Substance Use Topics  . Alcohol use: No  . Drug use: No     Family Hx: The patient's family history is not on file.  ROS:   Please see the history of present illness.    All other systems reviewed and are negative.  Labs/Other Tests and Data Reviewed:    Recent Labs: 11/09/2019: ALT 30; BUN 16; Creatinine, Ser 1.07; Hemoglobin 13.3; Platelets 289; Potassium 4.5; Sodium 143; TSH 2.550   Recent Lipid Panel Lab Results  Component Value Date/Time   CHOL 246 11/09/2019 12:00 AM   TRIG 188 11/09/2019 12:00 AM   HDL 35 (L) 11/09/2019 12:00 AM   LDLCALC 176 11/09/2019 12:00 AM    Wt Readings from Last 3 Encounters:  11/09/19 222 lb 12.8 oz (101.1 kg)  10/24/19 220 lb (99.8 kg)     Objective:    Vital Signs:  BP 140/90   Pulse 93   Temp 98.6 F (37 C)   SpO2 99%    VITAL SIGNS:  reviewed  ASSESSMENT & PLAN:    1. URI/bronchitis - possible COVID - rx for zpack and prednisone as directed - COVID PCR pending - recommend rest/fluids and quarantine until results back  COVID-19 Education: The signs and symptoms of COVID-19 were discussed with the patient and how to seek care for testing (follow up with PCP or arrange E-visit). The importance of social distancing was discussed today.  Time:   Today, I  have spent 10 minutes with the patient with telehealth technology discussing the above problems.     Medication Adjustments/Labs and Tests Ordered: Current medicines are reviewed at length with the patient today.  Concerns regarding medicines are outlined above.   Tests Ordered: Orders Placed This Encounter  Procedures  . Novel Coronavirus, NAA ([facility])    Medication Changes: Meds ordered this encounter  Medications  . azithromycin (ZITHROMAX) 250 MG tablet    Sig: 2 po day one then 1 po days 2-5    Dispense:  6 tablet    Refill:  0    Order Specific Question:   Supervising Provider    AnswerCorey Harold  . predniSONE (DELTASONE)  20 MG tablet    Sig: Take 3 tablets (60 mg total) by mouth daily with breakfast for 3 days, THEN 2 tablets (40 mg total) daily with breakfast for 3 days, THEN 1 tablet (20 mg total) daily with breakfast for 3 days.    Dispense:  18 tablet    Refill:  0    Order Specific Question:   Supervising Provider    AnswerCorey Harold    Follow Up:  In Person prn  Signed, Vickey Sages, PA-C  03/21/2020 3:30 PM    [facility] [place]

## 2020-03-22 LAB — NOVEL CORONAVIRUS, NAA: SARS-CoV-2, NAA: NOT DETECTED

## 2020-03-22 LAB — SARS-COV-2, NAA 2 DAY TAT

## 2020-11-12 ENCOUNTER — Encounter: Payer: 59 | Admitting: Physician Assistant

## 2021-06-25 DIAGNOSIS — T3132 Burns involving 30-39% of body surface with 20-29% third degree burns: Secondary | ICD-10-CM | POA: Insufficient documentation

## 2021-07-11 DIAGNOSIS — N179 Acute kidney failure, unspecified: Secondary | ICD-10-CM | POA: Insufficient documentation

## 2021-07-12 DIAGNOSIS — S2249XA Multiple fractures of ribs, unspecified side, initial encounter for closed fracture: Secondary | ICD-10-CM | POA: Insufficient documentation

## 2021-07-12 DIAGNOSIS — S7292XA Unspecified fracture of left femur, initial encounter for closed fracture: Secondary | ICD-10-CM | POA: Insufficient documentation

## 2021-07-12 DIAGNOSIS — I82411 Acute embolism and thrombosis of right femoral vein: Secondary | ICD-10-CM | POA: Insufficient documentation

## 2021-07-12 DIAGNOSIS — D6859 Other primary thrombophilia: Secondary | ICD-10-CM | POA: Insufficient documentation

## 2021-07-12 DIAGNOSIS — S12601A Unspecified nondisplaced fracture of seventh cervical vertebra, initial encounter for closed fracture: Secondary | ICD-10-CM | POA: Insufficient documentation

## 2021-07-12 DIAGNOSIS — S93314A Dislocation of tarsal joint of right foot, initial encounter: Secondary | ICD-10-CM | POA: Insufficient documentation

## 2021-07-12 DIAGNOSIS — S82002A Unspecified fracture of left patella, initial encounter for closed fracture: Secondary | ICD-10-CM | POA: Insufficient documentation

## 2021-07-12 DIAGNOSIS — T07XXXA Unspecified multiple injuries, initial encounter: Secondary | ICD-10-CM | POA: Insufficient documentation

## 2021-07-12 DIAGNOSIS — S82201A Unspecified fracture of shaft of right tibia, initial encounter for closed fracture: Secondary | ICD-10-CM | POA: Insufficient documentation

## 2021-12-11 DIAGNOSIS — T24399A Burn of third degree of multiple sites of unspecified lower limb, except ankle and foot, initial encounter: Secondary | ICD-10-CM | POA: Insufficient documentation

## 2021-12-11 DIAGNOSIS — T22392D Burn of third degree of multiple sites of left shoulder and upper limb, except wrist and hand, subsequent encounter: Secondary | ICD-10-CM | POA: Insufficient documentation

## 2021-12-11 DIAGNOSIS — L89629 Pressure ulcer of left heel, unspecified stage: Secondary | ICD-10-CM | POA: Insufficient documentation

## 2021-12-11 DIAGNOSIS — T23391D Burn of third degree of multiple sites of right wrist and hand, subsequent encounter: Secondary | ICD-10-CM | POA: Insufficient documentation

## 2021-12-11 DIAGNOSIS — L89619 Pressure ulcer of right heel, unspecified stage: Secondary | ICD-10-CM | POA: Insufficient documentation

## 2021-12-17 ENCOUNTER — Ambulatory Visit (INDEPENDENT_AMBULATORY_CARE_PROVIDER_SITE_OTHER): Payer: 59 | Admitting: Physician Assistant

## 2021-12-17 ENCOUNTER — Encounter: Payer: Self-pay | Admitting: Physician Assistant

## 2021-12-17 VITALS — BP 116/72 | HR 109 | Temp 97.6°F | Ht 73.0 in | Wt 193.0 lb

## 2021-12-17 DIAGNOSIS — Z7409 Other reduced mobility: Secondary | ICD-10-CM | POA: Diagnosis not present

## 2021-12-17 DIAGNOSIS — T300 Burn of unspecified body region, unspecified degree: Secondary | ICD-10-CM | POA: Diagnosis not present

## 2021-12-17 DIAGNOSIS — R899 Unspecified abnormal finding in specimens from other organs, systems and tissues: Secondary | ICD-10-CM

## 2021-12-17 DIAGNOSIS — Z86718 Personal history of other venous thrombosis and embolism: Secondary | ICD-10-CM

## 2021-12-17 DIAGNOSIS — Z87448 Personal history of other diseases of urinary system: Secondary | ICD-10-CM

## 2021-12-17 DIAGNOSIS — G8921 Chronic pain due to trauma: Secondary | ICD-10-CM | POA: Insufficient documentation

## 2021-12-17 MED ORDER — ONDANSETRON HCL 4 MG PO TABS: 4.0000 mg | ORAL_TABLET | Freq: Three times a day (TID) | ORAL | 0 refills | Status: DC | PRN

## 2021-12-17 MED ORDER — BUPRENORPHINE 20 MCG/HR TD PTWK: 1.0000 | MEDICATED_PATCH | TRANSDERMAL | 2 refills | Status: DC

## 2021-12-17 MED ORDER — OXYCODONE HCL 15 MG PO TABS: 15.0000 mg | ORAL_TABLET | Freq: Four times a day (QID) | ORAL | 0 refills | Status: DC | PRN
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
## 2021-12-18 LAB — CBC WITH DIFFERENTIAL/PLATELET
Basophils Absolute: 0 10*3/uL (ref 0.0–0.2)
Basos: 1 %
EOS (ABSOLUTE): 0.2 10*3/uL (ref 0.0–0.4)
Eos: 4 %
Hematocrit: 26.6 % — ABNORMAL LOW (ref 37.5–51.0)
Hemoglobin: 8.8 g/dL — ABNORMAL LOW (ref 13.0–17.7)
Immature Grans (Abs): 0 10*3/uL (ref 0.0–0.1)
Immature Granulocytes: 1 %
Lymphocytes Absolute: 1.5 10*3/uL (ref 0.7–3.1)
Lymphs: 29 %
MCH: 29.2 pg (ref 26.6–33.0)
MCHC: 33.1 g/dL (ref 31.5–35.7)
MCV: 88 fL (ref 79–97)
Monocytes Absolute: 0.4 10*3/uL (ref 0.1–0.9)
Monocytes: 7 %
Neutrophils Absolute: 3.1 10*3/uL (ref 1.4–7.0)
Neutrophils: 58 %
Platelets: 346 10*3/uL (ref 150–450)
RBC: 3.01 x10E6/uL — ABNORMAL LOW (ref 4.14–5.80)
RDW: 13.9 % (ref 11.6–15.4)
WBC: 5.2 10*3/uL (ref 3.4–10.8)

## 2021-12-18 LAB — PAIN MGT SCRN (14 DRUGS), UR
Amphetamine Scrn, Ur: NEGATIVE ng/mL
BARBITURATE SCREEN URINE: NEGATIVE ng/mL
BENZODIAZEPINE SCREEN, URINE: NEGATIVE ng/mL
Buprenorphine, Urine: NEGATIVE ng/mL
CANNABINOIDS UR QL SCN: NEGATIVE ng/mL
Cocaine (Metab) Scrn, Ur: NEGATIVE ng/mL
Creatinine(Crt), U: 239.8 mg/dL (ref 20.0–300.0)
Fentanyl, Urine: NEGATIVE pg/mL
Meperidine Screen, Urine: NEGATIVE ng/mL
Methadone Screen, Urine: NEGATIVE ng/mL
OXYCODONE+OXYMORPHONE UR QL SCN: POSITIVE ng/mL — AB
Opiate Scrn, Ur: POSITIVE ng/mL — AB
Ph of Urine: 5.3 (ref 4.5–8.9)
Phencyclidine Qn, Ur: NEGATIVE ng/mL
Propoxyphene Scrn, Ur: NEGATIVE ng/mL
Tramadol Screen, Urine: NEGATIVE ng/mL

## 2021-12-18 LAB — COMPREHENSIVE METABOLIC PANEL
ALT: 13 IU/L (ref 0–44)
AST: 8 IU/L (ref 0–40)
Albumin/Globulin Ratio: 1.5 (ref 1.2–2.2)
Albumin: 4.3 g/dL (ref 4.1–5.1)
Alkaline Phosphatase: 192 IU/L — ABNORMAL HIGH (ref 44–121)
BUN/Creatinine Ratio: 15 (ref 9–20)
BUN: 25 mg/dL — ABNORMAL HIGH (ref 6–20)
Bilirubin Total: <0.2 mg/dL (ref 0.0–1.2)
CO2: 23 mmol/L (ref 20–29)
Calcium: 9.4 mg/dL (ref 8.7–10.2)
Chloride: 105 mmol/L (ref 96–106)
Creatinine, Ser: 1.71 mg/dL — ABNORMAL HIGH (ref 0.76–1.27)
Globulin, Total: 2.9 g/dL (ref 1.5–4.5)
Glucose: 105 mg/dL — ABNORMAL HIGH (ref 70–99)
Potassium: 4.5 mmol/L (ref 3.5–5.2)
Sodium: 147 mmol/L — ABNORMAL HIGH (ref 134–144)
Total Protein: 7.2 g/dL (ref 6.0–8.5)
eGFR: 52 mL/min/{1.73_m2} — ABNORMAL LOW (ref >59)

## 2021-12-18 LAB — MAGNESIUM: Magnesium: 1.8 mg/dL (ref 1.6–2.3)

## 2021-12-18 LAB — PHOSPHORUS: Phosphorus: 4.2 mg/dL — ABNORMAL HIGH (ref 2.8–4.1)

## 2021-12-30 ENCOUNTER — Other Ambulatory Visit: Payer: Self-pay

## 2021-12-30 ENCOUNTER — Other Ambulatory Visit: Payer: Self-pay | Admitting: Physician Assistant

## 2021-12-30 ENCOUNTER — Telehealth: Payer: Self-pay | Admitting: Physician Assistant

## 2021-12-30 DIAGNOSIS — Z87448 Personal history of other diseases of urinary system: Secondary | ICD-10-CM

## 2021-12-30 DIAGNOSIS — R899 Unspecified abnormal finding in specimens from other organs, systems and tissues: Secondary | ICD-10-CM

## 2021-12-30 MED ORDER — GABAPENTIN 300 MG PO CAPS: 900.0000 mg | ORAL_CAPSULE | Freq: Two times a day (BID) | ORAL | 2 refills | Status: DC

## 2021-12-30 MED ORDER — DULOXETINE HCL 30 MG PO CPEP: 30.0000 mg | ORAL_CAPSULE | Freq: Two times a day (BID) | ORAL | 2 refills | Status: DC

## 2021-12-30 MED ORDER — SEVELAMER CARBONATE 800 MG PO TABS: 1600.0000 mg | ORAL_TABLET | Freq: Three times a day (TID) | ORAL | 2 refills | Status: DC

## 2021-12-30 MED ORDER — ELIQUIS 5 MG PO TABS: 5.0000 mg | ORAL_TABLET | Freq: Two times a day (BID) | ORAL | 2 refills | Status: DC

## 2021-12-30 NOTE — Telephone Encounter (Signed)
Discussed labs with wife Cbc stable - last hgb on 9/18 was 8.4 - so 8.8 is improved - recommend to repeat this week Kidney functions stable Last BUN 26 - creatinine 1.71 and GFR at 49 Which is stable Phosphorus at 4.2  Recommend for pt to repeat labwork this week

## 2022-01-03 ENCOUNTER — Other Ambulatory Visit: Payer: Self-pay

## 2022-01-03 ENCOUNTER — Other Ambulatory Visit: Payer: 59

## 2022-01-03 DIAGNOSIS — Z87448 Personal history of other diseases of urinary system: Secondary | ICD-10-CM

## 2022-01-03 DIAGNOSIS — R899 Unspecified abnormal finding in specimens from other organs, systems and tissues: Secondary | ICD-10-CM

## 2022-01-04 LAB — CBC WITH DIFFERENTIAL/PLATELET
Basophils Absolute: 0 10*3/uL (ref 0.0–0.2)
Basos: 1 %
EOS (ABSOLUTE): 0.1 10*3/uL (ref 0.0–0.4)
Eos: 3 %
Hematocrit: 29 % — ABNORMAL LOW (ref 37.5–51.0)
Hemoglobin: 9.3 g/dL — ABNORMAL LOW (ref 13.0–17.7)
Immature Grans (Abs): 0 10*3/uL (ref 0.0–0.1)
Immature Granulocytes: 0 %
Lymphocytes Absolute: 1.1 10*3/uL (ref 0.7–3.1)
Lymphs: 26 %
MCH: 28.3 pg (ref 26.6–33.0)
MCHC: 32.1 g/dL (ref 31.5–35.7)
MCV: 88 fL (ref 79–97)
Monocytes Absolute: 0.4 10*3/uL (ref 0.1–0.9)
Monocytes: 9 %
Neutrophils Absolute: 2.6 10*3/uL (ref 1.4–7.0)
Neutrophils: 61 %
Platelets: 288 10*3/uL (ref 150–450)
RBC: 3.29 x10E6/uL — ABNORMAL LOW (ref 4.14–5.80)
RDW: 14.5 % (ref 11.6–15.4)
WBC: 4.3 10*3/uL (ref 3.4–10.8)

## 2022-01-04 LAB — COMPREHENSIVE METABOLIC PANEL
ALT: 26 IU/L (ref 0–44)
AST: 16 IU/L (ref 0–40)
Albumin/Globulin Ratio: 1.9 (ref 1.2–2.2)
Albumin: 4.5 g/dL (ref 4.1–5.1)
Alkaline Phosphatase: 201 IU/L — ABNORMAL HIGH (ref 44–121)
BUN/Creatinine Ratio: 15 (ref 9–20)
BUN: 24 mg/dL — ABNORMAL HIGH (ref 6–20)
Bilirubin Total: <0.2 mg/dL (ref 0.0–1.2)
CO2: 26 mmol/L (ref 20–29)
Calcium: 9.7 mg/dL (ref 8.7–10.2)
Chloride: 101 mmol/L (ref 96–106)
Creatinine, Ser: 1.65 mg/dL — ABNORMAL HIGH (ref 0.76–1.27)
Globulin, Total: 2.4 g/dL (ref 1.5–4.5)
Glucose: 80 mg/dL (ref 70–99)
Potassium: 4.6 mmol/L (ref 3.5–5.2)
Sodium: 140 mmol/L (ref 134–144)
Total Protein: 6.9 g/dL (ref 6.0–8.5)
eGFR: 54 mL/min/{1.73_m2} — ABNORMAL LOW (ref >59)

## 2022-01-04 LAB — PHOSPHORUS: Phosphorus: 4.1 mg/dL (ref 2.8–4.1)

## 2022-01-16 ENCOUNTER — Encounter: Payer: Self-pay | Admitting: Physician Assistant

## 2022-01-16 ENCOUNTER — Ambulatory Visit (INDEPENDENT_AMBULATORY_CARE_PROVIDER_SITE_OTHER): Payer: 59 | Admitting: Physician Assistant

## 2022-01-16 VITALS — BP 122/88 | HR 83 | Temp 97.8°F | Ht 73.0 in | Wt 223.0 lb

## 2022-01-16 DIAGNOSIS — Z86718 Personal history of other venous thrombosis and embolism: Secondary | ICD-10-CM | POA: Diagnosis not present

## 2022-01-16 DIAGNOSIS — T300 Burn of unspecified body region, unspecified degree: Secondary | ICD-10-CM

## 2022-01-16 DIAGNOSIS — G8921 Chronic pain due to trauma: Secondary | ICD-10-CM

## 2022-01-16 DIAGNOSIS — Z87448 Personal history of other diseases of urinary system: Secondary | ICD-10-CM

## 2022-01-16 DIAGNOSIS — R899 Unspecified abnormal finding in specimens from other organs, systems and tissues: Secondary | ICD-10-CM

## 2022-01-16 DIAGNOSIS — D649 Anemia, unspecified: Secondary | ICD-10-CM

## 2022-01-16 MED ORDER — TRAZODONE HCL 50 MG PO TABS: 50.0000 mg | ORAL_TABLET | Freq: Every day | ORAL | 1 refills | Status: DC

## 2022-01-16 MED ORDER — SEVELAMER CARBONATE 800 MG PO TABS: ORAL_TABLET | ORAL | 2 refills | Status: DC

## 2022-01-16 MED ORDER — GABAPENTIN 300 MG PO CAPS: 900.0000 mg | ORAL_CAPSULE | Freq: Two times a day (BID) | ORAL | 2 refills | Status: DC

## 2022-01-16 MED ORDER — BUPRENORPHINE 20 MCG/HR TD PTWK: 1.0000 | MEDICATED_PATCH | TRANSDERMAL | 2 refills | Status: DC

## 2022-01-16 MED ORDER — OXYCODONE HCL 15 MG PO TABS: 15.0000 mg | ORAL_TABLET | Freq: Four times a day (QID) | ORAL | 0 refills | Status: DC | PRN
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
## 2022-01-17 LAB — CBC WITH DIFFERENTIAL/PLATELET
Basophils Absolute: 0.1 10*3/uL (ref 0.0–0.2)
Basos: 1 %
EOS (ABSOLUTE): 0.2 10*3/uL (ref 0.0–0.4)
Eos: 4 %
Hematocrit: 30.2 % — ABNORMAL LOW (ref 37.5–51.0)
Hemoglobin: 10 g/dL — ABNORMAL LOW (ref 13.0–17.7)
Immature Grans (Abs): 0 10*3/uL (ref 0.0–0.1)
Immature Granulocytes: 0 %
Lymphocytes Absolute: 1.2 10*3/uL (ref 0.7–3.1)
Lymphs: 27 %
MCH: 28.7 pg (ref 26.6–33.0)
MCHC: 33.1 g/dL (ref 31.5–35.7)
MCV: 87 fL (ref 79–97)
Monocytes Absolute: 0.4 10*3/uL (ref 0.1–0.9)
Monocytes: 8 %
Neutrophils Absolute: 2.7 10*3/uL (ref 1.4–7.0)
Neutrophils: 60 %
Platelets: 290 10*3/uL (ref 150–450)
RBC: 3.48 x10E6/uL — ABNORMAL LOW (ref 4.14–5.80)
RDW: 14.6 % (ref 11.6–15.4)
WBC: 4.6 10*3/uL (ref 3.4–10.8)

## 2022-01-17 LAB — COMPREHENSIVE METABOLIC PANEL
ALT: 29 IU/L (ref 0–44)
AST: 17 IU/L (ref 0–40)
Albumin/Globulin Ratio: 1.5 (ref 1.2–2.2)
Albumin: 4.5 g/dL (ref 4.1–5.1)
Alkaline Phosphatase: 198 IU/L — ABNORMAL HIGH (ref 44–121)
BUN/Creatinine Ratio: 16 (ref 9–20)
BUN: 25 mg/dL — ABNORMAL HIGH (ref 6–20)
Bilirubin Total: <0.2 mg/dL (ref 0.0–1.2)
CO2: 26 mmol/L (ref 20–29)
Calcium: 9.6 mg/dL (ref 8.7–10.2)
Chloride: 99 mmol/L (ref 96–106)
Creatinine, Ser: 1.52 mg/dL — ABNORMAL HIGH (ref 0.76–1.27)
Globulin, Total: 3 g/dL (ref 1.5–4.5)
Glucose: 79 mg/dL (ref 70–99)
Potassium: 4.5 mmol/L (ref 3.5–5.2)
Sodium: 142 mmol/L (ref 134–144)
Total Protein: 7.5 g/dL (ref 6.0–8.5)
eGFR: 60 mL/min/{1.73_m2} (ref >59)

## 2022-01-17 LAB — PHOSPHORUS: Phosphorus: 4.6 mg/dL — ABNORMAL HIGH (ref 2.8–4.1)

## 2022-01-20 ENCOUNTER — Telehealth: Payer: Self-pay

## 2022-01-20 ENCOUNTER — Other Ambulatory Visit: Payer: Self-pay | Admitting: Physician Assistant

## 2022-01-20 MED ORDER — SEVELAMER CARBONATE 800 MG PO TABS: ORAL_TABLET | ORAL | 2 refills | Status: DC

## 2022-01-20 NOTE — Telephone Encounter (Signed)
Called wife to make her aware of labs results, Patient wife Made Aware, Verbalized Understanding.

## 2022-01-30 ENCOUNTER — Other Ambulatory Visit: Payer: Self-pay | Admitting: Physician Assistant

## 2022-01-30 LAB — IRON,TIBC AND FERRITIN PANEL
Ferritin: 1031 ng/mL — ABNORMAL HIGH (ref 30–400)
Iron Saturation: 23 % (ref 15–55)
Iron: 58 ug/dL (ref 38–169)
Total Iron Binding Capacity: 257 ug/dL (ref 250–450)
UIBC: 199 ug/dL (ref 111–343)

## 2022-01-30 LAB — SPECIMEN STATUS REPORT

## 2022-01-30 LAB — B12 AND FOLATE PANEL
Folate: 8 ng/mL (ref >3.0)
Vitamin B-12: 372 pg/mL (ref 232–1245)

## 2022-02-03 ENCOUNTER — Other Ambulatory Visit: Payer: Self-pay | Admitting: Physician Assistant

## 2022-02-03 MED ORDER — AZITHROMYCIN 250 MG PO TABS: ORAL_TABLET | ORAL | 0 refills | Status: AC

## 2022-02-12 ENCOUNTER — Telehealth: Payer: Self-pay | Admitting: Physician Assistant

## 2022-02-12 NOTE — Telephone Encounter (Signed)
[employer] CALLED STATED HE CAN GET A CASE MANAGER THRU [employer] INSURANCE TO TO CALL THE NUMBER RON THE BACK.

## 2022-02-16 ENCOUNTER — Other Ambulatory Visit: Payer: Self-pay | Admitting: Physician Assistant

## 2022-02-19 ENCOUNTER — Encounter: Payer: Self-pay | Admitting: Physician Assistant

## 2022-02-19 ENCOUNTER — Ambulatory Visit (INDEPENDENT_AMBULATORY_CARE_PROVIDER_SITE_OTHER): Payer: 59 | Admitting: Physician Assistant

## 2022-02-19 VITALS — BP 122/88 | HR 103 | Temp 96.0°F | Ht 73.0 in | Wt 223.0 lb

## 2022-02-19 DIAGNOSIS — Z87448 Personal history of other diseases of urinary system: Secondary | ICD-10-CM

## 2022-02-19 DIAGNOSIS — R899 Unspecified abnormal finding in specimens from other organs, systems and tissues: Secondary | ICD-10-CM

## 2022-02-19 DIAGNOSIS — R109 Unspecified abdominal pain: Secondary | ICD-10-CM | POA: Diagnosis not present

## 2022-02-19 DIAGNOSIS — G8921 Chronic pain due to trauma: Secondary | ICD-10-CM | POA: Diagnosis not present

## 2022-02-19 LAB — POCT URINALYSIS DIP (CLINITEK)
Bilirubin, UA: NEGATIVE
Blood, UA: NEGATIVE
Glucose, UA: NEGATIVE mg/dL
Ketones, POC UA: NEGATIVE mg/dL
Leukocytes, UA: NEGATIVE
Nitrite, UA: NEGATIVE
Spec Grav, UA: 1.025 (ref 1.010–1.025)
Urobilinogen, UA: 0.2 E.U./dL
pH, UA: 5 (ref 5.0–8.0)

## 2022-02-19 MED ORDER — CYCLOBENZAPRINE HCL 5 MG PO TABS: 5.0000 mg | ORAL_TABLET | Freq: Every day | ORAL | 1 refills | Status: DC | PRN

## 2022-02-19 MED ORDER — OXYCODONE HCL 15 MG PO TABS: 15.0000 mg | ORAL_TABLET | Freq: Three times a day (TID) | ORAL | 0 refills | Status: DC | PRN
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
## 2022-02-20 LAB — COMPREHENSIVE METABOLIC PANEL
ALT: 32 IU/L (ref 0–44)
AST: 22 IU/L (ref 0–40)
Albumin/Globulin Ratio: 1.6 (ref 1.2–2.2)
Albumin: 4.6 g/dL (ref 4.1–5.1)
Alkaline Phosphatase: 188 IU/L — ABNORMAL HIGH (ref 44–121)
BUN/Creatinine Ratio: 18 (ref 9–20)
BUN: 24 mg/dL — ABNORMAL HIGH (ref 6–20)
Bilirubin Total: <0.2 mg/dL (ref 0.0–1.2)
CO2: 26 mmol/L (ref 20–29)
Calcium: 9.4 mg/dL (ref 8.7–10.2)
Chloride: 102 mmol/L (ref 96–106)
Creatinine, Ser: 1.31 mg/dL — ABNORMAL HIGH (ref 0.76–1.27)
Globulin, Total: 2.8 g/dL (ref 1.5–4.5)
Glucose: 114 mg/dL — ABNORMAL HIGH (ref 70–99)
Potassium: 4.3 mmol/L (ref 3.5–5.2)
Sodium: 142 mmol/L (ref 134–144)
Total Protein: 7.4 g/dL (ref 6.0–8.5)
eGFR: 71 mL/min/{1.73_m2} (ref >59)

## 2022-02-20 LAB — CBC WITH DIFFERENTIAL/PLATELET
Basophils Absolute: 0 10*3/uL (ref 0.0–0.2)
Basos: 1 %
EOS (ABSOLUTE): 0.2 10*3/uL (ref 0.0–0.4)
Eos: 4 %
Hematocrit: 32.4 % — ABNORMAL LOW (ref 37.5–51.0)
Hemoglobin: 10.9 g/dL — ABNORMAL LOW (ref 13.0–17.7)
Immature Grans (Abs): 0 10*3/uL (ref 0.0–0.1)
Immature Granulocytes: 0 %
Lymphocytes Absolute: 1.5 10*3/uL (ref 0.7–3.1)
Lymphs: 27 %
MCH: 30.2 pg (ref 26.6–33.0)
MCHC: 33.6 g/dL (ref 31.5–35.7)
MCV: 90 fL (ref 79–97)
Monocytes Absolute: 0.5 10*3/uL (ref 0.1–0.9)
Monocytes: 9 %
Neutrophils Absolute: 3.2 10*3/uL (ref 1.4–7.0)
Neutrophils: 59 %
Platelets: 274 10*3/uL (ref 150–450)
RBC: 3.61 x10E6/uL — ABNORMAL LOW (ref 4.14–5.80)
RDW: 15.5 % — ABNORMAL HIGH (ref 11.6–15.4)
WBC: 5.3 10*3/uL (ref 3.4–10.8)

## 2022-02-20 LAB — IRON,TIBC AND FERRITIN PANEL
Ferritin: 851 ng/mL — ABNORMAL HIGH (ref 30–400)
Iron Saturation: 17 % (ref 15–55)
Iron: 44 ug/dL (ref 38–169)
Total Iron Binding Capacity: 261 ug/dL (ref 250–450)
UIBC: 217 ug/dL (ref 111–343)

## 2022-02-20 LAB — PHOSPHORUS: Phosphorus: 3.9 mg/dL (ref 2.8–4.1)

## 2022-02-25 DIAGNOSIS — L905 Scar conditions and fibrosis of skin: Secondary | ICD-10-CM | POA: Insufficient documentation

## 2022-03-03 ENCOUNTER — Other Ambulatory Visit: Payer: Self-pay

## 2022-03-03 DIAGNOSIS — G8921 Chronic pain due to trauma: Secondary | ICD-10-CM

## 2022-03-03 MED ORDER — GABAPENTIN 300 MG PO CAPS: 900.0000 mg | ORAL_CAPSULE | Freq: Every day | ORAL | 2 refills | Status: DC

## 2022-03-03 NOTE — Telephone Encounter (Signed)
Patient wife called and stated he takes gabapentin 300 mg 3 in the morning and 3 in the evening, she would like a 30 day supply sent in please because only 10 days are being sent in. Please advise

## 2022-03-19 ENCOUNTER — Ambulatory Visit (INDEPENDENT_AMBULATORY_CARE_PROVIDER_SITE_OTHER): Payer: 59 | Admitting: Physician Assistant

## 2022-03-19 ENCOUNTER — Encounter: Payer: Self-pay | Admitting: Physician Assistant

## 2022-03-19 VITALS — BP 122/82 | HR 98 | Temp 97.7°F | Ht 73.0 in | Wt 240.0 lb

## 2022-03-19 DIAGNOSIS — G8921 Chronic pain due to trauma: Secondary | ICD-10-CM

## 2022-03-19 DIAGNOSIS — Z87448 Personal history of other diseases of urinary system: Secondary | ICD-10-CM | POA: Diagnosis not present

## 2022-03-19 DIAGNOSIS — R899 Unspecified abnormal finding in specimens from other organs, systems and tissues: Secondary | ICD-10-CM

## 2022-03-19 DIAGNOSIS — L600 Ingrowing nail: Secondary | ICD-10-CM

## 2022-03-19 MED ORDER — SEVELAMER CARBONATE 800 MG PO TABS: ORAL_TABLET | ORAL | 0 refills | Status: DC

## 2022-03-19 MED ORDER — BUPRENORPHINE 20 MCG/HR TD PTWK: 1.0000 | MEDICATED_PATCH | TRANSDERMAL | 2 refills | Status: DC
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
## 2022-03-20 ENCOUNTER — Other Ambulatory Visit: Payer: Self-pay | Admitting: Physician Assistant

## 2022-03-20 DIAGNOSIS — Z87448 Personal history of other diseases of urinary system: Secondary | ICD-10-CM

## 2022-03-20 DIAGNOSIS — R899 Unspecified abnormal finding in specimens from other organs, systems and tissues: Secondary | ICD-10-CM

## 2022-03-20 LAB — CBC WITH DIFFERENTIAL/PLATELET
Basophils Absolute: 0 10*3/uL (ref 0.0–0.2)
Basos: 0 %
EOS (ABSOLUTE): 0.1 10*3/uL (ref 0.0–0.4)
Eos: 2 %
Hematocrit: 31.9 % — ABNORMAL LOW (ref 37.5–51.0)
Hemoglobin: 11 g/dL — ABNORMAL LOW (ref 13.0–17.7)
Immature Grans (Abs): 0 10*3/uL (ref 0.0–0.1)
Immature Granulocytes: 0 %
Lymphocytes Absolute: 1.2 10*3/uL (ref 0.7–3.1)
Lymphs: 16 %
MCH: 30.1 pg (ref 26.6–33.0)
MCHC: 34.5 g/dL (ref 31.5–35.7)
MCV: 87 fL (ref 79–97)
Monocytes Absolute: 0.7 10*3/uL (ref 0.1–0.9)
Monocytes: 9 %
Neutrophils Absolute: 5.4 10*3/uL (ref 1.4–7.0)
Neutrophils: 73 %
Platelets: 248 10*3/uL (ref 150–450)
RBC: 3.65 x10E6/uL — ABNORMAL LOW (ref 4.14–5.80)
RDW: 14.7 % (ref 11.6–15.4)
WBC: 7.4 10*3/uL (ref 3.4–10.8)

## 2022-03-20 LAB — COMPREHENSIVE METABOLIC PANEL
ALT: 59 IU/L — ABNORMAL HIGH (ref 0–44)
AST: 41 IU/L — ABNORMAL HIGH (ref 0–40)
Albumin/Globulin Ratio: 1.7 (ref 1.2–2.2)
Albumin: 4.5 g/dL (ref 4.1–5.1)
Alkaline Phosphatase: 189 IU/L — ABNORMAL HIGH (ref 44–121)
BUN/Creatinine Ratio: 17 (ref 9–20)
BUN: 25 mg/dL — ABNORMAL HIGH (ref 6–20)
Bilirubin Total: 0.3 mg/dL (ref 0.0–1.2)
CO2: 25 mmol/L (ref 20–29)
Calcium: 9.1 mg/dL (ref 8.7–10.2)
Chloride: 100 mmol/L (ref 96–106)
Creatinine, Ser: 1.45 mg/dL — ABNORMAL HIGH (ref 0.76–1.27)
Globulin, Total: 2.7 g/dL (ref 1.5–4.5)
Glucose: 116 mg/dL — ABNORMAL HIGH (ref 70–99)
Potassium: 4.1 mmol/L (ref 3.5–5.2)
Sodium: 139 mmol/L (ref 134–144)
Total Protein: 7.2 g/dL (ref 6.0–8.5)
eGFR: 63 mL/min/{1.73_m2} (ref >59)

## 2022-03-20 LAB — IRON,TIBC AND FERRITIN PANEL
Ferritin: 881 ng/mL — ABNORMAL HIGH (ref 30–400)
Iron Saturation: 8 % — CL (ref 15–55)
Iron: 20 ug/dL — ABNORMAL LOW (ref 38–169)
Total Iron Binding Capacity: 241 ug/dL — ABNORMAL LOW (ref 250–450)
UIBC: 221 ug/dL (ref 111–343)

## 2022-03-20 LAB — PHOSPHORUS: Phosphorus: 3.8 mg/dL (ref 2.8–4.1)

## 2022-03-20 MED ORDER — SEVELAMER CARBONATE 800 MG PO TABS: ORAL_TABLET | ORAL | 0 refills | Status: DC

## 2022-03-24 ENCOUNTER — Ambulatory Visit (INDEPENDENT_AMBULATORY_CARE_PROVIDER_SITE_OTHER): Payer: 59 | Admitting: Podiatry

## 2022-03-24 DIAGNOSIS — L03032 Cellulitis of left toe: Secondary | ICD-10-CM

## 2022-03-24 DIAGNOSIS — L600 Ingrowing nail: Secondary | ICD-10-CM | POA: Diagnosis not present

## 2022-03-24 MED ORDER — CEPHALEXIN 500 MG PO CAPS: 500.0000 mg | ORAL_CAPSULE | Freq: Three times a day (TID) | ORAL | 0 refills | Status: AC

## 2022-03-24 NOTE — Patient Instructions (Signed)
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
## 2022-03-26 ENCOUNTER — Other Ambulatory Visit: Payer: Self-pay | Admitting: Physician Assistant

## 2022-04-07 ENCOUNTER — Other Ambulatory Visit: Payer: Self-pay

## 2022-04-07 DIAGNOSIS — G8921 Chronic pain due to trauma: Secondary | ICD-10-CM

## 2022-04-08 ENCOUNTER — Ambulatory Visit: Payer: 59

## 2022-04-08 ENCOUNTER — Ambulatory Visit (INDEPENDENT_AMBULATORY_CARE_PROVIDER_SITE_OTHER): Payer: 59 | Admitting: Podiatry

## 2022-04-08 DIAGNOSIS — Z87448 Personal history of other diseases of urinary system: Secondary | ICD-10-CM

## 2022-04-08 DIAGNOSIS — L03032 Cellulitis of left toe: Secondary | ICD-10-CM | POA: Diagnosis not present

## 2022-04-08 DIAGNOSIS — L600 Ingrowing nail: Secondary | ICD-10-CM | POA: Diagnosis not present

## 2022-04-08 DIAGNOSIS — R899 Unspecified abnormal finding in specimens from other organs, systems and tissues: Secondary | ICD-10-CM

## 2022-04-08 MED ORDER — OXYCODONE HCL 15 MG PO TABS: 15.0000 mg | ORAL_TABLET | Freq: Three times a day (TID) | ORAL | 0 refills | Status: DC | PRN

## 2022-04-08 NOTE — Progress Notes (Signed)
Subjective: Cody Watson is a 37 y.o.  male returns to office today for follow up evaluation after having left Hallux lateral border nail ingrown removal with phenol and alcohol matrixectomy approximately 2 weeks ago. Patient has been soaking using epsom salts and applying topical antibiotic covered with bandaid daily. Patient denies fevers, chills, nausea, vomiting. Denies any calf pain, chest pain, SOB.   Objective:  Vitals: Reviewed  General: Well developed, nourished, in no acute distress, alert and oriented x3   Dermatology: Skin is warm, dry and supple bilateral. left hallux nail border appears to be clean, dry, with mild granular tissue and surrounding scab. There is no surrounding erythema, edema, drainage/purulence. The remaining nails appear unremarkable at this time. There are no other lesions or other signs of infection present.  Neurovascular status: Intact. No lower extremity swelling; No pain with calf compression bilateral.  Musculoskeletal: Decreased tenderness to palpation of the left hallux nail fold(s). Muscular strength within normal limits bilateral.   Assesement and Plan: S/p phenol and alcohol matrixectomy to the  left hallux nail lateral, doing well.   -Continue soaking in epsom salts twice a day followed by antibiotic ointment and a band-aid. Can leave uncovered at night. Continue this until completely healed.  -If the area has not healed in 2 weeks, call the office for follow-up appointment, or sooner if any problems arise.  -Monitor for any signs/symptoms of infection. Call the office immediately if any occur or go directly to the emergency room. Call with any questions/concerns.        Everitt Amber, [facility]                   04/08/2022

## 2022-04-09 LAB — CBC WITH DIFFERENTIAL/PLATELET
Basophils Absolute: 0 10*3/uL (ref 0.0–0.2)
Basos: 1 %
EOS (ABSOLUTE): 0.1 10*3/uL (ref 0.0–0.4)
Eos: 3 %
Hematocrit: 33.2 % — ABNORMAL LOW (ref 37.5–51.0)
Hemoglobin: 11.5 g/dL — ABNORMAL LOW (ref 13.0–17.7)
Immature Grans (Abs): 0 10*3/uL (ref 0.0–0.1)
Immature Granulocytes: 0 %
Lymphocytes Absolute: 1.4 10*3/uL (ref 0.7–3.1)
Lymphs: 27 %
MCH: 30.3 pg (ref 26.6–33.0)
MCHC: 34.6 g/dL (ref 31.5–35.7)
MCV: 87 fL (ref 79–97)
Monocytes Absolute: 0.5 10*3/uL (ref 0.1–0.9)
Monocytes: 9 %
Neutrophils Absolute: 3 10*3/uL (ref 1.4–7.0)
Neutrophils: 60 %
Platelets: 262 10*3/uL (ref 150–450)
RBC: 3.8 x10E6/uL — ABNORMAL LOW (ref 4.14–5.80)
RDW: 14 % (ref 11.6–15.4)
WBC: 5 10*3/uL (ref 3.4–10.8)

## 2022-04-09 LAB — COMPREHENSIVE METABOLIC PANEL
ALT: 62 IU/L — ABNORMAL HIGH (ref 0–44)
AST: 37 IU/L (ref 0–40)
Albumin/Globulin Ratio: 1.6 (ref 1.2–2.2)
Albumin: 4.4 g/dL (ref 4.1–5.1)
Alkaline Phosphatase: 206 IU/L — ABNORMAL HIGH (ref 44–121)
BUN/Creatinine Ratio: 16 (ref 9–20)
BUN: 21 mg/dL — ABNORMAL HIGH (ref 6–20)
Bilirubin Total: 0.3 mg/dL (ref 0.0–1.2)
CO2: 24 mmol/L (ref 20–29)
Calcium: 9.5 mg/dL (ref 8.7–10.2)
Chloride: 100 mmol/L (ref 96–106)
Creatinine, Ser: 1.32 mg/dL — ABNORMAL HIGH (ref 0.76–1.27)
Globulin, Total: 2.8 g/dL (ref 1.5–4.5)
Glucose: 113 mg/dL — ABNORMAL HIGH (ref 70–99)
Potassium: 4.3 mmol/L (ref 3.5–5.2)
Sodium: 142 mmol/L (ref 134–144)
Total Protein: 7.2 g/dL (ref 6.0–8.5)
eGFR: 71 mL/min/{1.73_m2} (ref >59)

## 2022-04-09 LAB — IRON,TIBC AND FERRITIN PANEL
Ferritin: 740 ng/mL — ABNORMAL HIGH (ref 30–400)
Iron Saturation: 19 % (ref 15–55)
Iron: 43 ug/dL (ref 38–169)
Total Iron Binding Capacity: 221 ug/dL — ABNORMAL LOW (ref 250–450)
UIBC: 178 ug/dL (ref 111–343)

## 2022-04-09 LAB — PHOSPHORUS: Phosphorus: 4.2 mg/dL — ABNORMAL HIGH (ref 2.8–4.1)

## 2022-05-07 ENCOUNTER — Other Ambulatory Visit: Payer: Self-pay

## 2022-05-07 DIAGNOSIS — G8921 Chronic pain due to trauma: Secondary | ICD-10-CM

## 2022-05-07 MED ORDER — OXYCODONE HCL 15 MG PO TABS: 15.0000 mg | ORAL_TABLET | Freq: Three times a day (TID) | ORAL | 0 refills | Status: DC | PRN

## 2022-05-22 ENCOUNTER — Encounter: Payer: Self-pay | Admitting: Physician Assistant

## 2022-05-22 ENCOUNTER — Ambulatory Visit (INDEPENDENT_AMBULATORY_CARE_PROVIDER_SITE_OTHER): Payer: 59 | Admitting: Physician Assistant

## 2022-05-22 VITALS — BP 122/86 | HR 76 | Temp 97.8°F | Resp 12 | Ht 73.0 in | Wt 242.0 lb

## 2022-05-22 DIAGNOSIS — I4891 Unspecified atrial fibrillation: Secondary | ICD-10-CM

## 2022-05-22 DIAGNOSIS — R7989 Other specified abnormal findings of blood chemistry: Secondary | ICD-10-CM

## 2022-05-22 DIAGNOSIS — Z87448 Personal history of other diseases of urinary system: Secondary | ICD-10-CM

## 2022-05-22 DIAGNOSIS — G8921 Chronic pain due to trauma: Secondary | ICD-10-CM | POA: Diagnosis not present

## 2022-05-22 DIAGNOSIS — T300 Burn of unspecified body region, unspecified degree: Secondary | ICD-10-CM | POA: Diagnosis not present

## 2022-05-22 DIAGNOSIS — R739 Hyperglycemia, unspecified: Secondary | ICD-10-CM

## 2022-05-22 DIAGNOSIS — Z86718 Personal history of other venous thrombosis and embolism: Secondary | ICD-10-CM

## 2022-05-22 DIAGNOSIS — R9431 Abnormal electrocardiogram [ECG] [EKG]: Secondary | ICD-10-CM

## 2022-05-22 DIAGNOSIS — D649 Anemia, unspecified: Secondary | ICD-10-CM

## 2022-05-22 DIAGNOSIS — Z7409 Other reduced mobility: Secondary | ICD-10-CM

## 2022-05-22 MED ORDER — GABAPENTIN 300 MG PO CAPS: 900.0000 mg | ORAL_CAPSULE | Freq: Every day | ORAL | 2 refills | Status: DC

## 2022-05-22 MED ORDER — DULOXETINE HCL 30 MG PO CPEP: 30.0000 mg | ORAL_CAPSULE | Freq: Two times a day (BID) | ORAL | 1 refills | Status: DC

## 2022-05-22 MED ORDER — BUPRENORPHINE 20 MCG/HR TD PTWK: 1.0000 | MEDICATED_PATCH | TRANSDERMAL | 2 refills | Status: DC

## 2022-05-22 MED ORDER — ONDANSETRON HCL 4 MG PO TABS: 4.0000 mg | ORAL_TABLET | Freq: Three times a day (TID) | ORAL | 1 refills | Status: AC | PRN
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
## 2022-05-23 LAB — COMPREHENSIVE METABOLIC PANEL
ALT: 55 IU/L — ABNORMAL HIGH (ref 0–44)
AST: 31 IU/L (ref 0–40)
Albumin/Globulin Ratio: 1.8 (ref 1.2–2.2)
Albumin: 4.5 g/dL (ref 4.1–5.1)
Alkaline Phosphatase: 200 IU/L — ABNORMAL HIGH (ref 44–121)
BUN/Creatinine Ratio: 18 (ref 9–20)
BUN: 23 mg/dL — ABNORMAL HIGH (ref 6–20)
Bilirubin Total: 0.2 mg/dL (ref 0.0–1.2)
CO2: 23 mmol/L (ref 20–29)
Calcium: 9.3 mg/dL (ref 8.7–10.2)
Chloride: 101 mmol/L (ref 96–106)
Creatinine, Ser: 1.28 mg/dL — ABNORMAL HIGH (ref 0.76–1.27)
Globulin, Total: 2.5 g/dL (ref 1.5–4.5)
Glucose: 78 mg/dL (ref 70–99)
Potassium: 4.5 mmol/L (ref 3.5–5.2)
Sodium: 140 mmol/L (ref 134–144)
Total Protein: 7 g/dL (ref 6.0–8.5)
eGFR: 73 mL/min/{1.73_m2} (ref >59)

## 2022-05-23 LAB — IRON,TIBC AND FERRITIN PANEL
Ferritin: 792 ng/mL — ABNORMAL HIGH (ref 30–400)
Iron Saturation: 26 % (ref 15–55)
Iron: 63 ug/dL (ref 38–169)
Total Iron Binding Capacity: 241 ug/dL — ABNORMAL LOW (ref 250–450)
UIBC: 178 ug/dL (ref 111–343)

## 2022-05-23 LAB — CBC WITH DIFFERENTIAL/PLATELET
Basophils Absolute: 0 10*3/uL (ref 0.0–0.2)
Basos: 1 %
EOS (ABSOLUTE): 0.2 10*3/uL (ref 0.0–0.4)
Eos: 4 %
Hematocrit: 37.3 % — ABNORMAL LOW (ref 37.5–51.0)
Hemoglobin: 12.4 g/dL — ABNORMAL LOW (ref 13.0–17.7)
Immature Grans (Abs): 0 10*3/uL (ref 0.0–0.1)
Immature Granulocytes: 0 %
Lymphocytes Absolute: 1.3 10*3/uL (ref 0.7–3.1)
Lymphs: 36 %
MCH: 29.5 pg (ref 26.6–33.0)
MCHC: 33.2 g/dL (ref 31.5–35.7)
MCV: 89 fL (ref 79–97)
Monocytes Absolute: 0.4 10*3/uL (ref 0.1–0.9)
Monocytes: 12 %
Neutrophils Absolute: 1.6 10*3/uL (ref 1.4–7.0)
Neutrophils: 47 %
Platelets: 212 10*3/uL (ref 150–450)
RBC: 4.2 x10E6/uL (ref 4.14–5.80)
RDW: 13.4 % (ref 11.6–15.4)
WBC: 3.5 10*3/uL (ref 3.4–10.8)

## 2022-05-23 LAB — TSH: TSH: 3.69 u[IU]/mL (ref 0.450–4.500)

## 2022-05-23 LAB — HEMOGLOBIN A1C
Est. average glucose Bld gHb Est-mCnc: 111 mg/dL
Hgb A1c MFr Bld: 5.5 % (ref 4.8–5.6)

## 2022-05-23 LAB — PHOSPHORUS: Phosphorus: 3.6 mg/dL (ref 2.8–4.1)

## 2022-05-26 ENCOUNTER — Other Ambulatory Visit: Payer: Self-pay | Admitting: Physician Assistant

## 2022-05-26 DIAGNOSIS — R7989 Other specified abnormal findings of blood chemistry: Secondary | ICD-10-CM

## 2022-05-27 ENCOUNTER — Telehealth: Payer: Self-pay | Admitting: Internal Medicine

## 2022-05-27 NOTE — Telephone Encounter (Signed)
scheduled per 3/11 referral, pt has been called and confirmed date and time. Pt is aware of location and to arrive early for check in

## 2022-06-03 ENCOUNTER — Telehealth: Payer: Self-pay

## 2022-06-03 ENCOUNTER — Other Ambulatory Visit: Payer: Self-pay | Admitting: Physician Assistant

## 2022-06-03 MED ORDER — OXYCODONE HCL 10 MG PO TABS: 10.0000 mg | ORAL_TABLET | Freq: Three times a day (TID) | ORAL | 0 refills | Status: DC | PRN

## 2022-06-03 NOTE — Telephone Encounter (Signed)
Rx sent 

## 2022-06-03 NOTE — Telephone Encounter (Signed)
Patient's wife called and states that she was told to call when patient was about a week or two before running out of pain medication and that this time you were going to call in Oxycodone 10 mg instead of the Oxycodone 15 mg. Please send medication in to [facility] [place].

## 2022-06-04 ENCOUNTER — Ambulatory Visit (INDEPENDENT_AMBULATORY_CARE_PROVIDER_SITE_OTHER): Payer: 59 | Admitting: Physician Assistant

## 2022-06-04 ENCOUNTER — Encounter: Payer: Self-pay | Admitting: Physician Assistant

## 2022-06-04 ENCOUNTER — Other Ambulatory Visit: Payer: Self-pay | Admitting: Physician Assistant

## 2022-06-04 VITALS — BP 142/100 | HR 96 | Temp 97.2°F | Ht 73.0 in | Wt 248.2 lb

## 2022-06-04 DIAGNOSIS — I10 Essential (primary) hypertension: Secondary | ICD-10-CM

## 2022-06-04 MED ORDER — HYDROCHLOROTHIAZIDE 25 MG PO TABS: 25.0000 mg | ORAL_TABLET | Freq: Every day | ORAL | 3 refills | Status: DC

## 2022-06-04 NOTE — Progress Notes (Signed)
Established Patient Office Visit  Subjective:  Patient ID: Cody Watson, male    DOB: 1986/01/14  Age: 37 y.o. MRN: [id number]  CC:  Chief Complaint  Patient presents with   Hypertension    HPI Latrel Luongo presents for elevated blood pressure.  Pt has had borderline blood pressure readings diastolically the last few visits.  Earlier this week he checked bp at [facility] and it was elevated at 161/108.  His wife is a Therapist, sports and has continued to check bp and it has ranged in the 160s/100s.  He denies chest pain, dyspnea.  He has had some headaches.  Pt has had extensive skin grafts and reconstruction surgery of lower legs - he feels like his calves are swelling through the day and then in mornings his skin is not tight or swollen   Past Medical History:  Diagnosis Date   Irritable bowel syndrome (IBS)     History reviewed. No pertinent surgical history.  History reviewed. No pertinent family history.  Social History   Socioeconomic History   Marital status: Married    Spouse name: Not on file   Number of children: Not on file   Years of education: Not on file   Highest education level: Not on file  Occupational History   Not on file  Tobacco Use   Smoking status: Former    Packs/day: 1.00    Years: 8.00    Additional pack years: 0.00    Total pack years: 8.00    Types: Cigarettes    Quit date: 2012    Years since quitting: 12.2   Smokeless tobacco: Never  Substance and Sexual Activity   Alcohol use: No   Drug use: No   Sexual activity: Not on file  Other Topics Concern   Not on file  Social History Narrative   Not on file   Social Determinants of Health   Financial Resource Strain: Not on file  Food Insecurity: Not on file  Transportation Needs: Not on file  Physical Activity: Not on file  Stress: Not on file  Social Connections: Not on file  Intimate Partner Violence: Not on file     Current Outpatient Medications:    Acetaminophen Extra Strength 500 MG  TABS, Take 2 tablets by mouth every 8 (eight) hours., Disp: , Rfl:    buprenorphine (BUTRANS) 20 MCG/HR PTWK, Place 1 patch onto the skin once a week., Disp: 4 patch, Rfl: 2   Calcium Carb-Cholecalciferol (CALCIUM CARBONATE+VITAMIN D PO), Take by mouth., Disp: , Rfl:    [facility] SENNA 8.6 MG tablet, Take 2 tablets by mouth daily., Disp: , Rfl:    cyclobenzaprine (FLEXERIL) 5 MG tablet, Take 1 tablet (5 mg total) by mouth daily as needed for muscle spasms., Disp: 90 tablet, Rfl: 1   DULoxetine (CYMBALTA) 30 MG capsule, Take 1 capsule (30 mg total) by mouth 2 (two) times daily., Disp: 180 capsule, Rfl: 1   ELIQUIS 5 MG TABS tablet, TAKE 1 TABLET BY MOUTH TWICE A DAY, Disp: 60 tablet, Rfl: 2   gabapentin (NEURONTIN) 300 MG capsule, Take 3 capsules (900 mg total) by mouth 6 (six) times daily., Disp: 180 capsule, Rfl: 2   hydrochlorothiazide (HYDRODIURIL) 25 MG tablet, Take 1 tablet (25 mg total) by mouth daily., Disp: 30 tablet, Rfl: 3   hydrOXYzine (ATARAX) 50 MG tablet, Take 50 mg by mouth at bedtime as needed., Disp: , Rfl:    mupirocin ointment (BACTROBAN) 2 %, Apply topically daily., Disp: , Rfl:  naloxone (NARCAN) nasal spray 4 mg/0.1 mL, SMARTSIG:Both Nares, Disp: , Rfl:    ondansetron (ZOFRAN) 4 MG tablet, Take 1 tablet (4 mg total) by mouth every 8 (eight) hours as needed for nausea or vomiting., Disp: 30 tablet, Rfl: 1   Oxycodone HCl 10 MG TABS, Take 1 tablet (10 mg total) by mouth every 8 (eight) hours as needed., Disp: 90 tablet, Rfl: 0   sevelamer carbonate (RENVELA) 800 MG tablet, TAKE 2 TABLETS (1,600 MG TOTAL) BY MOUTH bid, Disp: 360 tablet, Rfl: 0   Skin Protectants, Misc. (DERMACERIN EX), Apply topically., Disp: , Rfl:    traZODone (DESYREL) 50 MG tablet, Take 1 tablet (50 mg total) by mouth at bedtime. qhs, Disp: 90 tablet, Rfl: 1   No Known Allergies  ROS CONSTITUTIONAL: Negative for chills, fatigue, fever,  CARDIOVASCULAR: Negative for chest pain, dizziness, palpitations   RESPIRATORY: Negative for recent cough and dyspnea.  GASTROINTESTINAL: Negative for abdominal pain,          Objective:    PHYSICAL EXAM:   VS: BP (!) 142/100 (BP Location: Left Arm, Patient Position: Sitting, Cuff Size: Large)   Pulse 96   Temp (!) 97.2 F (36.2 C) (Temporal)   Ht 6\' 1"  (1.854 m)   Wt 248 lb 3.2 oz (112.6 kg)   SpO2 99%   BMI 32.75 kg/m   GEN: Well nourished, well developed, in no acute distress  Cardiac: RRR; no murmurs,  Respiratory:  normal respiratory rate and pattern with no distress - normal breath sounds with no rales, rhonchi, wheezes or rubs       There are no preventive care reminders to display for this patient.  There are no preventive care reminders to display for this patient.  Lab Results  Component Value Date   TSH 3.690 05/22/2022   Lab Results  Component Value Date   WBC 3.5 05/22/2022   HGB 12.4 (L) 05/22/2022   HCT 37.3 (L) 05/22/2022   MCV 89 05/22/2022   PLT 212 05/22/2022   Lab Results  Component Value Date   NA 140 05/22/2022   K 4.5 05/22/2022   CO2 23 05/22/2022   GLUCOSE 78 05/22/2022   BUN 23 (H) 05/22/2022   CREATININE 1.28 (H) 05/22/2022   BILITOT 0.2 05/22/2022   ALKPHOS 200 (H) 05/22/2022   AST 31 05/22/2022   ALT 55 (H) 05/22/2022   PROT 7.0 05/22/2022   ALBUMIN 4.5 05/22/2022   CALCIUM 9.3 05/22/2022   ANIONGAP 8 11/06/2014   EGFR 73 05/22/2022   Lab Results  Component Value Date   CHOL 246 11/09/2019   Lab Results  Component Value Date   HDL 35 (L) 11/09/2019   Lab Results  Component Value Date   LDLCALC 176 11/09/2019   Lab Results  Component Value Date   TRIG 188 11/09/2019   No results found for: "CHOLHDL" Lab Results  Component Value Date   HGBA1C 5.5 05/22/2022      Assessment & Plan:   Problem List Items Addressed This Visit   None Visit Diagnoses     Essential hypertension, benign    -  Primary   Relevant Medications   hydrochlorothiazide (HYDRODIURIL) 25 MG  tablet       Meds ordered this encounter  Medications   hydrochlorothiazide (HYDRODIURIL) 25 MG tablet    Sig: Take 1 tablet (25 mg total) by mouth daily.    Dispense:  30 tablet    Refill:  3    Order Specific  Question:   Supervising Provider    AnswerRochel Brome [id number]    Follow-up: Return in about 3 weeks (around 06/25/2022) for bp check and cmp.    SARA R Yurani Fettes, PA-C

## 2022-06-04 NOTE — Progress Notes (Unsigned)
Established Patient Office Visit  Subjective:  Patient ID: Cody Watson, male    DOB: 1985/10/28  Age: 37 y.o. MRN: [id number]  CC: No chief complaint on file.   HPI Cody Watson presents for ***  Past Medical History:  Diagnosis Date   Irritable bowel syndrome (IBS)     No past surgical history on file.  No family history on file.  Social History   Socioeconomic History   Marital status: Married    Spouse name: Not on file   Number of children: Not on file   Years of education: Not on file   Highest education level: Not on file  Occupational History   Not on file  Tobacco Use   Smoking status: Former    Packs/day: 1.00    Years: 8.00    Additional pack years: 0.00    Total pack years: 8.00    Types: Cigarettes    Quit date: 2012    Years since quitting: 12.2   Smokeless tobacco: Never  Substance and Sexual Activity   Alcohol use: No   Drug use: No   Sexual activity: Not on file  Other Topics Concern   Not on file  Social History Narrative   Not on file   Social Determinants of Health   Financial Resource Strain: Not on file  Food Insecurity: Not on file  Transportation Needs: Not on file  Physical Activity: Not on file  Stress: Not on file  Social Connections: Not on file  Intimate Partner Violence: Not on file     Current Outpatient Medications:    Acetaminophen Extra Strength 500 MG TABS, Take 2 tablets by mouth every 8 (eight) hours., Disp: , Rfl:    buprenorphine (BUTRANS) 20 MCG/HR PTWK, Place 1 patch onto the skin once a week., Disp: 4 patch, Rfl: 2   Calcium Carb-Cholecalciferol (CALCIUM CARBONATE+VITAMIN D PO), Take by mouth., Disp: , Rfl:    [facility] SENNA 8.6 MG tablet, Take 2 tablets by mouth daily., Disp: , Rfl:    cyclobenzaprine (FLEXERIL) 5 MG tablet, Take 1 tablet (5 mg total) by mouth daily as needed for muscle spasms., Disp: 90 tablet, Rfl: 1   DULoxetine (CYMBALTA) 30 MG capsule, Take 1 capsule (30 mg total) by mouth 2 (two) times  daily., Disp: 180 capsule, Rfl: 1   ELIQUIS 5 MG TABS tablet, TAKE 1 TABLET BY MOUTH TWICE A DAY, Disp: 60 tablet, Rfl: 2   gabapentin (NEURONTIN) 300 MG capsule, Take 3 capsules (900 mg total) by mouth 6 (six) times daily., Disp: 180 capsule, Rfl: 2   hydrochlorothiazide (HYDRODIURIL) 25 MG tablet, Take 1 tablet (25 mg total) by mouth daily., Disp: 30 tablet, Rfl: 3   hydrOXYzine (ATARAX) 50 MG tablet, Take 50 mg by mouth at bedtime as needed., Disp: , Rfl:    mupirocin ointment (BACTROBAN) 2 %, Apply topically daily., Disp: , Rfl:    naloxone (NARCAN) nasal spray 4 mg/0.1 mL, SMARTSIG:Both Nares, Disp: , Rfl:    ondansetron (ZOFRAN) 4 MG tablet, Take 1 tablet (4 mg total) by mouth every 8 (eight) hours as needed for nausea or vomiting., Disp: 30 tablet, Rfl: 1   Oxycodone HCl 10 MG TABS, Take 1 tablet (10 mg total) by mouth every 8 (eight) hours as needed., Disp: 90 tablet, Rfl: 0   sevelamer carbonate (RENVELA) 800 MG tablet, TAKE 2 TABLETS (1,600 MG TOTAL) BY MOUTH bid, Disp: 360 tablet, Rfl: 0   Skin Protectants, Misc. (DERMACERIN EX), Apply topically., Disp: ,  Rfl:    traZODone (DESYREL) 50 MG tablet, Take 1 tablet (50 mg total) by mouth at bedtime. qhs, Disp: 90 tablet, Rfl: 1   No Known Allergies  ROS CONSTITUTIONAL: Negative for chills, fatigue, fever, unintentional weight gain and unintentional weight loss.  E/N/T: Negative for ear pain, nasal congestion and sore throat.  CARDIOVASCULAR: Negative for chest pain, dizziness, palpitations and pedal edema.  RESPIRATORY: Negative for recent cough and dyspnea.  GASTROINTESTINAL: Negative for abdominal pain, acid reflux symptoms, constipation, diarrhea, nausea and vomiting.  MSK: Negative for arthralgias and myalgias.  INTEGUMENTARY: Negative for rash.  NEUROLOGICAL: Negative for dizziness and headaches.  PSYCHIATRIC: Negative for sleep disturbance and to question depression screen.  Negative for depression, negative for anhedonia.         Objective:    PHYSICAL EXAM:   VS: There were no vitals taken for this visit.  GEN: Well nourished, well developed, in no acute distress  HEENT: normal external ears and nose - normal external auditory canals and TMS - hearing grossly normal - normal nasal mucosa and septum - Lips, Teeth and Gums - normal  Oropharynx - normal mucosa, palate, and posterior pharynx Neck: no JVD or masses - no thyromegaly Cardiac: RRR; no murmurs, rubs, or gallops,no edema - no significant varicosities Respiratory:  normal respiratory rate and pattern with no distress - normal breath sounds with no rales, rhonchi, wheezes or rubs GI: normal bowel sounds, no masses or tenderness MS: no deformity or atrophy  Skin: warm and dry, no rash  Neuro:  Alert and Oriented x 3, Strength and sensation are intact - CN II-Xii grossly intact Psych: euthymic mood, appropriate affect and demeanor  There were no vitals taken for this visit. Wt Readings from Last 3 Encounters:  06/04/22 248 lb 3.2 oz (112.6 kg)  05/22/22 242 lb (109.8 kg)  03/19/22 240 lb (108.9 kg)     There are no preventive care reminders to display for this patient.  There are no preventive care reminders to display for this patient.  Lab Results  Component Value Date   TSH 3.690 05/22/2022   Lab Results  Component Value Date   WBC 3.5 05/22/2022   HGB 12.4 (L) 05/22/2022   HCT 37.3 (L) 05/22/2022   MCV 89 05/22/2022   PLT 212 05/22/2022   Lab Results  Component Value Date   NA 140 05/22/2022   K 4.5 05/22/2022   CO2 23 05/22/2022   GLUCOSE 78 05/22/2022   BUN 23 (H) 05/22/2022   CREATININE 1.28 (H) 05/22/2022   BILITOT 0.2 05/22/2022   ALKPHOS 200 (H) 05/22/2022   AST 31 05/22/2022   ALT 55 (H) 05/22/2022   PROT 7.0 05/22/2022   ALBUMIN 4.5 05/22/2022   CALCIUM 9.3 05/22/2022   ANIONGAP 8 11/06/2014   EGFR 73 05/22/2022   Lab Results  Component Value Date   CHOL 246 11/09/2019   Lab Results  Component Value Date    HDL 35 (L) 11/09/2019   Lab Results  Component Value Date   LDLCALC 176 11/09/2019   Lab Results  Component Value Date   TRIG 188 11/09/2019   No results found for: "CHOLHDL" Lab Results  Component Value Date   HGBA1C 5.5 05/22/2022      Assessment & Plan:   Problem List Items Addressed This Visit   None Visit Diagnoses     Essential hypertension, benign    -  Primary   Relevant Orders   Comprehensive metabolic panel  No orders of the defined types were placed in this encounter.   Follow-up: No follow-ups on file.    SARA R Lynne Righi, PA-C

## 2022-06-11 ENCOUNTER — Telehealth: Payer: Self-pay

## 2022-06-11 NOTE — Telephone Encounter (Signed)
Patient's wife informed

## 2022-06-11 NOTE — Telephone Encounter (Signed)
Patient called stating that since trying the lower dose of the pain medication (oxycodone) his pain has gotten worse and he states that its not going to work. Please advise.
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
## 2022-06-23 ENCOUNTER — Inpatient Hospital Stay: Payer: 59

## 2022-06-23 ENCOUNTER — Inpatient Hospital Stay: Payer: 59 | Attending: Physician Assistant | Admitting: Physician Assistant

## 2022-06-23 ENCOUNTER — Other Ambulatory Visit: Payer: Self-pay

## 2022-06-23 ENCOUNTER — Other Ambulatory Visit: Payer: Self-pay | Admitting: Physician Assistant

## 2022-06-23 VITALS — BP 149/92 | HR 91 | Temp 97.9°F | Resp 17 | Ht 73.0 in | Wt 254.7 lb

## 2022-06-23 DIAGNOSIS — Z7901 Long term (current) use of anticoagulants: Secondary | ICD-10-CM | POA: Insufficient documentation

## 2022-06-23 DIAGNOSIS — D649 Anemia, unspecified: Secondary | ICD-10-CM | POA: Insufficient documentation

## 2022-06-23 DIAGNOSIS — I4891 Unspecified atrial fibrillation: Secondary | ICD-10-CM | POA: Insufficient documentation

## 2022-06-23 DIAGNOSIS — Z87891 Personal history of nicotine dependence: Secondary | ICD-10-CM | POA: Diagnosis not present

## 2022-06-23 DIAGNOSIS — Z8249 Family history of ischemic heart disease and other diseases of the circulatory system: Secondary | ICD-10-CM | POA: Diagnosis not present

## 2022-06-23 DIAGNOSIS — K760 Fatty (change of) liver, not elsewhere classified: Secondary | ICD-10-CM | POA: Insufficient documentation

## 2022-06-23 DIAGNOSIS — M2550 Pain in unspecified joint: Secondary | ICD-10-CM | POA: Diagnosis not present

## 2022-06-23 DIAGNOSIS — Z79899 Other long term (current) drug therapy: Secondary | ICD-10-CM | POA: Insufficient documentation

## 2022-06-23 DIAGNOSIS — Z8349 Family history of other endocrine, nutritional and metabolic diseases: Secondary | ICD-10-CM | POA: Insufficient documentation

## 2022-06-23 DIAGNOSIS — Z86718 Personal history of other venous thrombosis and embolism: Secondary | ICD-10-CM

## 2022-06-23 DIAGNOSIS — R7989 Other specified abnormal findings of blood chemistry: Secondary | ICD-10-CM

## 2022-06-23 LAB — CMP (CANCER CENTER ONLY)
ALT: 133 U/L — ABNORMAL HIGH (ref 0–44)
AST: 55 U/L — ABNORMAL HIGH (ref 15–41)
Albumin: 4.8 g/dL (ref 3.5–5.0)
Alkaline Phosphatase: 173 U/L — ABNORMAL HIGH (ref 38–126)
Anion gap: 10 (ref 5–15)
BUN: 20 mg/dL (ref 6–20)
CO2: 29 mmol/L (ref 22–32)
Calcium: 10.1 mg/dL (ref 8.9–10.3)
Chloride: 99 mmol/L (ref 98–111)
Creatinine: 1.37 mg/dL — ABNORMAL HIGH (ref 0.61–1.24)
GFR, Estimated: >60 mL/min (ref >60)
Glucose, Bld: 99 mg/dL (ref 70–99)
Potassium: 4 mmol/L (ref 3.5–5.1)
Sodium: 138 mmol/L (ref 135–145)
Total Bilirubin: 0.4 mg/dL (ref 0.3–1.2)
Total Protein: 7.9 g/dL (ref 6.5–8.1)

## 2022-06-23 LAB — CBC WITH DIFFERENTIAL (CANCER CENTER ONLY)
Abs Immature Granulocytes: 0.04 10*3/uL (ref 0.00–0.07)
Basophils Absolute: 0 10*3/uL (ref 0.0–0.1)
Basophils Relative: 0 %
Eosinophils Absolute: 0.2 10*3/uL (ref 0.0–0.5)
Eosinophils Relative: 2 %
HCT: 37.6 % — ABNORMAL LOW (ref 39.0–52.0)
Hemoglobin: 12.5 g/dL — ABNORMAL LOW (ref 13.0–17.0)
Immature Granulocytes: 1 %
Lymphocytes Relative: 25 %
Lymphs Abs: 1.7 10*3/uL (ref 0.7–4.0)
MCH: 29.6 pg (ref 26.0–34.0)
MCHC: 33.2 g/dL (ref 30.0–36.0)
MCV: 89.1 fL (ref 80.0–100.0)
Monocytes Absolute: 0.6 10*3/uL (ref 0.1–1.0)
Monocytes Relative: 9 %
Neutro Abs: 4.3 10*3/uL (ref 1.7–7.7)
Neutrophils Relative %: 63 %
Platelet Count: 201 10*3/uL (ref 150–400)
RBC: 4.22 MIL/uL (ref 4.22–5.81)
RDW: 13.3 % (ref 11.5–15.5)
WBC Count: 6.8 10*3/uL (ref 4.0–10.5)
nRBC: 0 % (ref 0.0–0.2)

## 2022-06-23 LAB — IRON AND IRON BINDING CAPACITY (CC-WL,HP ONLY)
Iron: 83 ug/dL (ref 45–182)
Saturation Ratios: 28 % (ref 17.9–39.5)
TIBC: 301 ug/dL (ref 250–450)
UIBC: 218 ug/dL (ref 117–376)

## 2022-06-23 LAB — FERRITIN: Ferritin: 743 ng/mL — ABNORMAL HIGH (ref 24–336)

## 2022-06-26 ENCOUNTER — Ambulatory Visit (INDEPENDENT_AMBULATORY_CARE_PROVIDER_SITE_OTHER): Payer: 59

## 2022-06-26 VITALS — BP 134/82 | HR 78

## 2022-06-26 DIAGNOSIS — I10 Essential (primary) hypertension: Secondary | ICD-10-CM

## 2022-06-26 NOTE — Progress Notes (Signed)
Will continue current meds and keep appt as scheduled

## 2022-06-26 NOTE — Progress Notes (Signed)
Patient presents for follow-up BP check per Marianne Sofia.     06/26/2022    4:07 PM 06/23/2022    1:21 PM 06/04/2022    3:43 PM  Vitals with BMI  Systolic 134 149 427  Diastolic 82 92 100  Pulse 78 91 96

## 2022-06-30 ENCOUNTER — Telehealth: Payer: Self-pay

## 2022-06-30 ENCOUNTER — Encounter: Payer: Self-pay | Admitting: Physician Assistant

## 2022-06-30 NOTE — Telephone Encounter (Signed)
Please advise 

## 2022-06-30 NOTE — Telephone Encounter (Signed)
error 

## 2022-07-01 ENCOUNTER — Other Ambulatory Visit: Payer: Self-pay | Admitting: Physician Assistant

## 2022-07-01 DIAGNOSIS — G8921 Chronic pain due to trauma: Secondary | ICD-10-CM

## 2022-07-01 MED ORDER — OXYCODONE HCL 15 MG PO TABA: 1.0000 | ORAL_TABLET | Freq: Three times a day (TID) | ORAL | 0 refills | Status: DC

## 2022-07-01 MED ORDER — OXYCODONE HCL 15 MG PO TABS
15.0000 mg | ORAL_TABLET | Freq: Three times a day (TID) | ORAL | 0 refills | Status: DC | PRN
Start: 2022-07-01 — End: 2022-07-23

## 2022-07-01 NOTE — Telephone Encounter (Signed)
Patient wife stated that if you could sent at least 10 days of the regular oxycodone until the PA is done for the ones you sent into pharmacy. He will be out after today and needed them

## 2022-07-01 NOTE — Telephone Encounter (Signed)
Patient's wife Made Aware, Verbalized Understanding.

## 2022-07-01 NOTE — Telephone Encounter (Signed)
PA done

## 2022-07-01 NOTE — Telephone Encounter (Signed)
Yes will send

## 2022-07-06 ENCOUNTER — Other Ambulatory Visit: Payer: Self-pay | Admitting: Physician Assistant

## 2022-07-06 DIAGNOSIS — G8921 Chronic pain due to trauma: Secondary | ICD-10-CM

## 2022-07-06 DIAGNOSIS — I10 Essential (primary) hypertension: Secondary | ICD-10-CM

## 2022-07-07 ENCOUNTER — Encounter: Payer: Self-pay | Admitting: Cardiovascular Disease

## 2022-07-07 ENCOUNTER — Encounter: Payer: Self-pay | Admitting: Physician Assistant

## 2022-07-07 LAB — HEMOCHROMATOSIS DNA-PCR(C282Y,H63D)
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
## 2022-07-08 ENCOUNTER — Ambulatory Visit: Payer: 59 | Attending: Cardiovascular Disease | Admitting: Cardiovascular Disease

## 2022-07-08 ENCOUNTER — Encounter: Payer: Self-pay | Admitting: Cardiovascular Disease

## 2022-07-08 VITALS — BP 138/88 | HR 88 | Ht 72.0 in | Wt 248.8 lb

## 2022-07-08 DIAGNOSIS — E782 Mixed hyperlipidemia: Secondary | ICD-10-CM | POA: Diagnosis not present

## 2022-07-08 DIAGNOSIS — I480 Paroxysmal atrial fibrillation: Secondary | ICD-10-CM | POA: Diagnosis not present

## 2022-07-08 MED ORDER — ELIQUIS 5 MG PO TABS
5.0000 mg | ORAL_TABLET | Freq: Two times a day (BID) | ORAL | 3 refills | Status: DC
Start: 2022-07-08 — End: 2023-06-16

## 2022-07-08 NOTE — Patient Instructions (Signed)
Medication Instructions:  REFILLED Eliquis *If you need a refill on your cardiac medications before your next appointment, please call your pharmacy*   Lab Work: Lipids, ALT, BMET today If you have labs (blood work) drawn today and your tests are completely normal, you will receive your results only by: MyChart Message (if you have MyChart) OR A paper copy in the mail If you have any lab test that is abnormal or we need to change your treatment, we will call you to review the results.   Testing/Procedures: NONE   Follow-Up: At [facility], you and your health needs are our priority.  As part of our continuing mission to provide you with exceptional heart care, we have created designated Provider Care Teams.  These Care Teams include your primary Cardiologist (physician) and Advanced Practice Providers (APPs -  Physician Assistants and Nurse Practitioners) who all work together to provide you with the care you need, when you need it.  We recommend signing up for the patient portal called "MyChart".  Sign up information is provided on this After Visit Summary.  MyChart is used to connect with patients for Virtual Visits (Telemedicine).  Patients are able to view lab/test results, encounter notes, upcoming appointments, etc.  Non-urgent messages can be sent to your provider as well.   To learn more about what you can do with MyChart, go to [email or web address].    Your next appointment:   1 year(s)  Provider:   Kristeen Miss, MD

## 2022-07-09 ENCOUNTER — Telehealth: Payer: Self-pay | Admitting: Cardiovascular Disease

## 2022-07-09 DIAGNOSIS — E782 Mixed hyperlipidemia: Secondary | ICD-10-CM

## 2022-07-09 DIAGNOSIS — Z79899 Other long term (current) drug therapy: Secondary | ICD-10-CM

## 2022-07-09 LAB — LIPID PANEL
Chol/HDL Ratio: 7.5 ratio — ABNORMAL HIGH (ref 0.0–5.0)
Cholesterol, Total: 218 mg/dL — ABNORMAL HIGH (ref 100–199)
HDL: 29 mg/dL — ABNORMAL LOW (ref >39)
LDL Chol Calc (NIH): 121 mg/dL — ABNORMAL HIGH (ref 0–99)
Triglycerides: 382 mg/dL — ABNORMAL HIGH (ref 0–149)
VLDL Cholesterol Cal: 68 mg/dL — ABNORMAL HIGH (ref 5–40)

## 2022-07-09 LAB — BASIC METABOLIC PANEL
BUN/Creatinine Ratio: 16 (ref 9–20)
BUN: 21 mg/dL — ABNORMAL HIGH (ref 6–20)
CO2: 25 mmol/L (ref 20–29)
Calcium: 10 mg/dL (ref 8.7–10.2)
Chloride: 100 mmol/L (ref 96–106)
Creatinine, Ser: 1.28 mg/dL — ABNORMAL HIGH (ref 0.76–1.27)
Glucose: 76 mg/dL (ref 70–99)
Potassium: 4.6 mmol/L (ref 3.5–5.2)
Sodium: 141 mmol/L (ref 134–144)
eGFR: 73 mL/min/{1.73_m2} (ref >59)

## 2022-07-09 LAB — ALT: ALT: 54 IU/L — ABNORMAL HIGH (ref 0–44)

## 2022-07-09 MED ORDER — ROSUVASTATIN CALCIUM 10 MG PO TABS
10.0000 mg | ORAL_TABLET | Freq: Every day | ORAL | 3 refills | Status: DC
Start: 2022-07-09 — End: 2023-06-16

## 2022-07-09 NOTE — Telephone Encounter (Signed)
-----   Message from Vesta Mixer, MD sent at 07/09/2022 10:39 AM EDT ----- Cholesterol and trigs are very elevated Lets add Rosuvastatin 10 mg a day. He needs to greatly reduce his intake of fats, carbs. Recheck lipids,  BMP, ALT in 3 months

## 2022-07-09 NOTE — Telephone Encounter (Signed)
Called and spoke with patient and wife via speakerphone. He agrees to plan. Medication sent to pharmacy on file. Repeat labs entered and scheduled for 10/08/22.

## 2022-07-21 ENCOUNTER — Other Ambulatory Visit: Payer: Self-pay | Admitting: Physician Assistant

## 2022-07-23 ENCOUNTER — Ambulatory Visit (INDEPENDENT_AMBULATORY_CARE_PROVIDER_SITE_OTHER): Payer: 59 | Admitting: Physician Assistant

## 2022-07-23 ENCOUNTER — Encounter: Payer: Self-pay | Admitting: Physician Assistant

## 2022-07-23 VITALS — BP 130/82 | HR 84 | Temp 97.7°F | Ht 72.0 in | Wt 251.0 lb

## 2022-07-23 DIAGNOSIS — I4891 Unspecified atrial fibrillation: Secondary | ICD-10-CM

## 2022-07-23 DIAGNOSIS — R7989 Other specified abnormal findings of blood chemistry: Secondary | ICD-10-CM

## 2022-07-23 DIAGNOSIS — T300 Burn of unspecified body region, unspecified degree: Secondary | ICD-10-CM

## 2022-07-23 DIAGNOSIS — E782 Mixed hyperlipidemia: Secondary | ICD-10-CM

## 2022-07-23 DIAGNOSIS — I10 Essential (primary) hypertension: Secondary | ICD-10-CM

## 2022-07-23 DIAGNOSIS — G8921 Chronic pain due to trauma: Secondary | ICD-10-CM

## 2022-07-23 DIAGNOSIS — Z87448 Personal history of other diseases of urinary system: Secondary | ICD-10-CM

## 2022-07-23 MED ORDER — ROXYBOND 15 MG PO TABA
15.0000 mg | ORAL_TABLET | Freq: Three times a day (TID) | ORAL | 0 refills | Status: DC | PRN
Start: 2022-07-23 — End: 2022-07-29

## 2022-07-23 MED ORDER — GABAPENTIN 300 MG PO CAPS
ORAL_CAPSULE | ORAL | 3 refills | Status: DC
Start: 2022-07-23 — End: 2022-08-08
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
## 2022-07-24 ENCOUNTER — Other Ambulatory Visit: Payer: Self-pay | Admitting: Physician Assistant

## 2022-07-24 LAB — COMPREHENSIVE METABOLIC PANEL
ALT: 74 IU/L — ABNORMAL HIGH (ref 0–44)
AST: 36 IU/L (ref 0–40)
Albumin/Globulin Ratio: 1.6 (ref 1.2–2.2)
Albumin: 4.4 g/dL (ref 4.1–5.1)
Alkaline Phosphatase: 210 IU/L — ABNORMAL HIGH (ref 44–121)
BUN/Creatinine Ratio: 19 (ref 9–20)
BUN: 23 mg/dL — ABNORMAL HIGH (ref 6–20)
Bilirubin Total: 0.3 mg/dL (ref 0.0–1.2)
CO2: 26 mmol/L (ref 20–29)
Calcium: 9.1 mg/dL (ref 8.7–10.2)
Chloride: 100 mmol/L (ref 96–106)
Creatinine, Ser: 1.23 mg/dL (ref 0.76–1.27)
Globulin, Total: 2.7 g/dL (ref 1.5–4.5)
Glucose: 98 mg/dL (ref 70–99)
Potassium: 4.2 mmol/L (ref 3.5–5.2)
Sodium: 143 mmol/L (ref 134–144)
Total Protein: 7.1 g/dL (ref 6.0–8.5)
eGFR: 77 mL/min/{1.73_m2} (ref >59)

## 2022-07-24 LAB — PHOSPHORUS: Phosphorus: 3.7 mg/dL (ref 2.8–4.1)

## 2022-07-24 MED ORDER — SEVELAMER CARBONATE 800 MG PO TABS
800.0000 mg | ORAL_TABLET | Freq: Two times a day (BID) | ORAL | 0 refills | Status: DC
Start: 2022-07-24 — End: 2022-07-25

## 2022-07-25 ENCOUNTER — Other Ambulatory Visit: Payer: Self-pay

## 2022-07-25 MED ORDER — SEVELAMER CARBONATE 800 MG PO TABS
800.0000 mg | ORAL_TABLET | Freq: Two times a day (BID) | ORAL | 0 refills | Status: DC
Start: 2022-07-25 — End: 2022-08-20

## 2022-07-29 ENCOUNTER — Other Ambulatory Visit: Payer: Self-pay | Admitting: Family Medicine

## 2022-07-29 ENCOUNTER — Telehealth: Payer: Self-pay

## 2022-07-29 DIAGNOSIS — G8921 Chronic pain due to trauma: Secondary | ICD-10-CM

## 2022-07-29 MED ORDER — ROXYBOND 15 MG PO TABA
15.0000 mg | ORAL_TABLET | Freq: Three times a day (TID) | ORAL | 0 refills | Status: DC | PRN
Start: 2022-07-29 — End: 2022-07-30

## 2022-07-29 NOTE — Telephone Encounter (Signed)
Patient's wife called and stated that they tried to fill the new pain medication that [facility] had prescribed which was roxybond and it was going to be 400$. She stated that they just wanted to go back to the oxycodone that he was on before. [facility] to [facility].

## 2022-07-29 NOTE — Telephone Encounter (Signed)
Resent with clear instructions to give generic. Dr. Sedalia Muta

## 2022-07-30 ENCOUNTER — Other Ambulatory Visit: Payer: Self-pay

## 2022-07-30 ENCOUNTER — Other Ambulatory Visit: Payer: Self-pay | Admitting: Family Medicine

## 2022-07-30 MED ORDER — OXYCODONE HCL 15 MG PO TABA: 15.0000 mg | ORAL_TABLET | Freq: Three times a day (TID) | ORAL | 0 refills | Status: DC | PRN

## 2022-07-30 MED ORDER — OXYCODONE HCL 15 MG PO TABS: 15.0000 mg | ORAL_TABLET | Freq: Three times a day (TID) | ORAL | 0 refills | Status: DC | PRN

## 2022-08-02 ENCOUNTER — Telehealth: Payer: 59 | Admitting: Nurse Practitioner

## 2022-08-02 DIAGNOSIS — J0100 Acute maxillary sinusitis, unspecified: Secondary | ICD-10-CM

## 2022-08-02 MED ORDER — AMOXICILLIN-POT CLAVULANATE 875-125 MG PO TABS: 1.0000 | ORAL_TABLET | Freq: Two times a day (BID) | ORAL | 0 refills | Status: DC

## 2022-08-02 NOTE — Progress Notes (Signed)

## 2022-08-07 ENCOUNTER — Other Ambulatory Visit: Payer: Self-pay | Admitting: Physician Assistant

## 2022-08-07 DIAGNOSIS — G8921 Chronic pain due to trauma: Secondary | ICD-10-CM

## 2022-08-19 ENCOUNTER — Other Ambulatory Visit: Payer: 59

## 2022-08-20 ENCOUNTER — Other Ambulatory Visit: Payer: Self-pay | Admitting: Physician Assistant

## 2022-08-20 LAB — PHOSPHORUS: Phosphorus: 3.3 mg/dL (ref 2.8–4.1)

## 2022-08-26 ENCOUNTER — Other Ambulatory Visit: Payer: Self-pay | Admitting: Physician Assistant

## 2022-08-26 ENCOUNTER — Other Ambulatory Visit: Payer: Self-pay | Admitting: Family Medicine

## 2022-08-26 DIAGNOSIS — G8921 Chronic pain due to trauma: Secondary | ICD-10-CM

## 2022-08-26 MED ORDER — OXYCODONE HCL 15 MG PO TABS: 15.0000 mg | ORAL_TABLET | Freq: Three times a day (TID) | ORAL | 0 refills | Status: DC | PRN

## 2022-09-09 ENCOUNTER — Ambulatory Visit: Payer: 59

## 2022-09-10 LAB — PHOSPHORUS: Phosphorus: 3.1 mg/dL (ref 2.8–4.1)

## 2022-09-22 ENCOUNTER — Encounter: Payer: Self-pay | Admitting: Physician Assistant

## 2022-09-25 ENCOUNTER — Other Ambulatory Visit: Payer: Self-pay | Admitting: Physician Assistant

## 2022-09-26 MED ORDER — OXYCODONE HCL 15 MG PO TABS: 15.0000 mg | ORAL_TABLET | Freq: Three times a day (TID) | ORAL | 0 refills | Status: DC | PRN

## 2022-09-29 ENCOUNTER — Other Ambulatory Visit: Payer: Self-pay

## 2022-09-29 ENCOUNTER — Encounter: Payer: Self-pay | Admitting: Physician Assistant

## 2022-09-29 MED ORDER — OXYCODONE HCL 15 MG PO TABS: 15.0000 mg | ORAL_TABLET | Freq: Three times a day (TID) | ORAL | 0 refills | Status: DC | PRN

## 2022-09-29 NOTE — Telephone Encounter (Signed)
Please fill this for patient - in [place]

## 2022-09-29 NOTE — Telephone Encounter (Signed)
Patient is out of town and needs medication sent to pharmacy near him.

## 2022-10-07 ENCOUNTER — Ambulatory Visit: Payer: 59 | Admitting: Podiatry

## 2022-10-08 ENCOUNTER — Other Ambulatory Visit: Payer: Self-pay | Admitting: Physician Assistant

## 2022-10-08 ENCOUNTER — Other Ambulatory Visit: Payer: 59

## 2022-10-08 DIAGNOSIS — G8921 Chronic pain due to trauma: Secondary | ICD-10-CM

## 2022-10-26 ENCOUNTER — Other Ambulatory Visit: Payer: Self-pay | Admitting: Physician Assistant

## 2022-10-27 ENCOUNTER — Encounter: Payer: Self-pay | Admitting: Physician Assistant

## 2022-10-28 ENCOUNTER — Other Ambulatory Visit: Payer: Self-pay

## 2022-10-28 DIAGNOSIS — G8921 Chronic pain due to trauma: Secondary | ICD-10-CM

## 2022-10-28 MED ORDER — BUPRENORPHINE 20 MCG/HR TD PTWK
1.0000 | MEDICATED_PATCH | TRANSDERMAL | 2 refills | Status: DC
Start: 2022-10-28 — End: 2023-01-19

## 2022-10-28 MED ORDER — OXYCODONE HCL 15 MG PO TABS: 15.0000 mg | ORAL_TABLET | Freq: Three times a day (TID) | ORAL | 0 refills | Status: DC | PRN

## 2022-11-06 ENCOUNTER — Ambulatory Visit: Payer: 59 | Admitting: Physician Assistant

## 2022-11-06 ENCOUNTER — Encounter: Payer: Self-pay | Admitting: Physician Assistant

## 2022-11-06 VITALS — BP 110/78 | HR 85 | Temp 97.5°F | Ht 72.0 in | Wt 254.2 lb

## 2022-11-06 DIAGNOSIS — G8921 Chronic pain due to trauma: Secondary | ICD-10-CM | POA: Diagnosis not present

## 2022-11-06 DIAGNOSIS — I4891 Unspecified atrial fibrillation: Secondary | ICD-10-CM | POA: Diagnosis not present

## 2022-11-06 DIAGNOSIS — I10 Essential (primary) hypertension: Secondary | ICD-10-CM | POA: Diagnosis not present

## 2022-11-06 DIAGNOSIS — R7989 Other specified abnormal findings of blood chemistry: Secondary | ICD-10-CM

## 2022-11-06 DIAGNOSIS — E782 Mixed hyperlipidemia: Secondary | ICD-10-CM
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
## 2022-11-07 LAB — CBC WITH DIFFERENTIAL/PLATELET
Basophils Absolute: 0 10*3/uL (ref 0.0–0.2)
Basos: 0 %
EOS (ABSOLUTE): 0.1 10*3/uL (ref 0.0–0.4)
Eos: 2 %
Hematocrit: 39.8 % (ref 37.5–51.0)
Hemoglobin: 12.9 g/dL — ABNORMAL LOW (ref 13.0–17.7)
Immature Grans (Abs): 0 10*3/uL (ref 0.0–0.1)
Immature Granulocytes: 0 %
Lymphocytes Absolute: 1.9 10*3/uL (ref 0.7–3.1)
Lymphs: 22 %
MCH: 29.1 pg (ref 26.6–33.0)
MCHC: 32.4 g/dL (ref 31.5–35.7)
MCV: 90 fL (ref 79–97)
Monocytes Absolute: 0.6 10*3/uL (ref 0.1–0.9)
Monocytes: 7 %
Neutrophils Absolute: 6 10*3/uL (ref 1.4–7.0)
Neutrophils: 69 %
Platelets: 245 10*3/uL (ref 150–450)
RBC: 4.43 x10E6/uL (ref 4.14–5.80)
RDW: 13.1 % (ref 11.6–15.4)
WBC: 8.7 10*3/uL (ref 3.4–10.8)

## 2022-11-07 LAB — COMPREHENSIVE METABOLIC PANEL
ALT: 27 IU/L (ref 0–44)
AST: 22 IU/L (ref 0–40)
Albumin: 4.4 g/dL (ref 4.1–5.1)
Alkaline Phosphatase: 159 IU/L — ABNORMAL HIGH (ref 44–121)
BUN/Creatinine Ratio: 15 (ref 9–20)
BUN: 20 mg/dL (ref 6–20)
Bilirubin Total: 0.5 mg/dL (ref 0.0–1.2)
CO2: 25 mmol/L (ref 20–29)
Calcium: 9.5 mg/dL (ref 8.7–10.2)
Chloride: 100 mmol/L (ref 96–106)
Creatinine, Ser: 1.37 mg/dL — ABNORMAL HIGH (ref 0.76–1.27)
Globulin, Total: 2.9 g/dL (ref 1.5–4.5)
Glucose: 70 mg/dL (ref 70–99)
Potassium: 4.2 mmol/L (ref 3.5–5.2)
Sodium: 140 mmol/L (ref 134–144)
Total Protein: 7.3 g/dL (ref 6.0–8.5)
eGFR: 67 mL/min/{1.73_m2} (ref >59)

## 2022-11-07 LAB — LIPID PANEL
Chol/HDL Ratio: 6.3 ratio — ABNORMAL HIGH (ref 0.0–5.0)
Cholesterol, Total: 175 mg/dL (ref 100–199)
HDL: 28 mg/dL — ABNORMAL LOW (ref >39)
LDL Chol Calc (NIH): 109 mg/dL — ABNORMAL HIGH (ref 0–99)
Triglycerides: 216 mg/dL — ABNORMAL HIGH (ref 0–149)
VLDL Cholesterol Cal: 38 mg/dL (ref 5–40)

## 2022-11-07 LAB — PHOSPHORUS: Phosphorus: 3.1 mg/dL (ref 2.8–4.1)

## 2022-11-07 LAB — FERRITIN: Ferritin: 924 ng/mL — ABNORMAL HIGH (ref 30–400)

## 2022-11-10 ENCOUNTER — Ambulatory Visit (INDEPENDENT_AMBULATORY_CARE_PROVIDER_SITE_OTHER): Payer: 59 | Admitting: Podiatry

## 2022-11-10 DIAGNOSIS — L600 Ingrowing nail: Secondary | ICD-10-CM | POA: Diagnosis not present
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
## 2022-11-25 ENCOUNTER — Other Ambulatory Visit: Payer: Self-pay | Admitting: Family Medicine

## 2022-11-25 MED ORDER — OXYCODONE HCL 15 MG PO TABS: 15.0000 mg | ORAL_TABLET | Freq: Three times a day (TID) | ORAL | 0 refills | Status: DC | PRN

## 2022-12-25 ENCOUNTER — Other Ambulatory Visit: Payer: Self-pay | Admitting: Physician Assistant

## 2022-12-25 MED ORDER — OXYCODONE HCL 15 MG PO TABS: 15.0000 mg | ORAL_TABLET | Freq: Three times a day (TID) | ORAL | 0 refills | Status: DC | PRN

## 2022-12-31 ENCOUNTER — Other Ambulatory Visit: Payer: Self-pay | Admitting: Physician Assistant

## 2022-12-31 DIAGNOSIS — G8921 Chronic pain due to trauma: Secondary | ICD-10-CM

## 2023-01-14 ENCOUNTER — Other Ambulatory Visit: Payer: Self-pay | Admitting: Physician Assistant

## 2023-01-14 DIAGNOSIS — G8921 Chronic pain due to trauma: Secondary | ICD-10-CM

## 2023-01-19 ENCOUNTER — Encounter: Payer: Self-pay | Admitting: Physician Assistant

## 2023-01-19 ENCOUNTER — Other Ambulatory Visit: Payer: Self-pay | Admitting: Physician Assistant

## 2023-01-19 DIAGNOSIS — G8921 Chronic pain due to trauma: Secondary | ICD-10-CM

## 2023-01-20 MED ORDER — BUPRENORPHINE 20 MCG/HR TD PTWK: 1.0000 | MEDICATED_PATCH | TRANSDERMAL | 2 refills | Status: DC

## 2023-01-20 MED ORDER — OXYCODONE HCL 15 MG PO TABS: 15.0000 mg | ORAL_TABLET | Freq: Three times a day (TID) | ORAL | 0 refills | Status: DC | PRN

## 2023-02-11 ENCOUNTER — Encounter: Payer: Self-pay | Admitting: Physician Assistant

## 2023-02-11 ENCOUNTER — Ambulatory Visit (INDEPENDENT_AMBULATORY_CARE_PROVIDER_SITE_OTHER): Payer: 59 | Admitting: Physician Assistant

## 2023-02-11 VITALS — BP 128/72 | HR 86 | Temp 97.8°F | Resp 16 | Ht 72.0 in | Wt 267.4 lb

## 2023-02-11 DIAGNOSIS — G8921 Chronic pain due to trauma: Secondary | ICD-10-CM | POA: Diagnosis not present

## 2023-02-11 DIAGNOSIS — E782 Mixed hyperlipidemia: Secondary | ICD-10-CM

## 2023-02-11 DIAGNOSIS — S81802S Unspecified open wound, left lower leg, sequela: Secondary | ICD-10-CM

## 2023-02-11 DIAGNOSIS — R7989 Other specified abnormal findings of blood chemistry: Secondary | ICD-10-CM

## 2023-02-11 DIAGNOSIS — Z7409 Other reduced mobility: Secondary | ICD-10-CM

## 2023-02-11 DIAGNOSIS — I10 Essential (primary) hypertension: Secondary | ICD-10-CM | POA: Diagnosis not present
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
## 2023-02-12 LAB — CBC WITH DIFFERENTIAL/PLATELET
Basophils Absolute: 0 10*3/uL (ref 0.0–0.2)
Basos: 1 %
EOS (ABSOLUTE): 0.1 10*3/uL (ref 0.0–0.4)
Eos: 2 %
Hematocrit: 40.8 % (ref 37.5–51.0)
Hemoglobin: 13.5 g/dL (ref 13.0–17.7)
Immature Grans (Abs): 0 10*3/uL (ref 0.0–0.1)
Immature Granulocytes: 0 %
Lymphocytes Absolute: 1.8 10*3/uL (ref 0.7–3.1)
Lymphs: 30 %
MCH: 29.8 pg (ref 26.6–33.0)
MCHC: 33.1 g/dL (ref 31.5–35.7)
MCV: 90 fL (ref 79–97)
Monocytes Absolute: 0.6 10*3/uL (ref 0.1–0.9)
Monocytes: 10 %
Neutrophils Absolute: 3.4 10*3/uL (ref 1.4–7.0)
Neutrophils: 57 %
Platelets: 227 10*3/uL (ref 150–450)
RBC: 4.53 x10E6/uL (ref 4.14–5.80)
RDW: 13 % (ref 11.6–15.4)
WBC: 5.9 10*3/uL (ref 3.4–10.8)

## 2023-02-12 LAB — COMPREHENSIVE METABOLIC PANEL
ALT: 42 [IU]/L (ref 0–44)
AST: 31 [IU]/L (ref 0–40)
Albumin: 4.6 g/dL (ref 4.1–5.1)
Alkaline Phosphatase: 151 [IU]/L — ABNORMAL HIGH (ref 44–121)
BUN/Creatinine Ratio: 15 (ref 9–20)
BUN: 18 mg/dL (ref 6–20)
Bilirubin Total: 0.3 mg/dL (ref 0.0–1.2)
CO2: 25 mmol/L (ref 20–29)
Calcium: 9.6 mg/dL (ref 8.7–10.2)
Chloride: 100 mmol/L (ref 96–106)
Creatinine, Ser: 1.24 mg/dL (ref 0.76–1.27)
Globulin, Total: 2.7 g/dL (ref 1.5–4.5)
Glucose: 79 mg/dL (ref 70–99)
Potassium: 4.2 mmol/L (ref 3.5–5.2)
Sodium: 140 mmol/L (ref 134–144)
Total Protein: 7.3 g/dL (ref 6.0–8.5)
eGFR: 76 mL/min/{1.73_m2} (ref >59)

## 2023-02-12 LAB — LIPID PANEL
Chol/HDL Ratio: 5.6 {ratio} — ABNORMAL HIGH (ref 0.0–5.0)
Cholesterol, Total: 145 mg/dL (ref 100–199)
HDL: 26 mg/dL — ABNORMAL LOW (ref >39)
LDL Chol Calc (NIH): 75 mg/dL (ref 0–99)
Triglycerides: 266 mg/dL — ABNORMAL HIGH (ref 0–149)
VLDL Cholesterol Cal: 44 mg/dL — ABNORMAL HIGH (ref 5–40)

## 2023-02-12 LAB — IRON,TIBC AND FERRITIN PANEL
Ferritin: 806 ng/mL — ABNORMAL HIGH (ref 30–400)
Iron Saturation: 35 % (ref 15–55)
Iron: 78 ug/dL (ref 38–169)
Total Iron Binding Capacity: 226 ug/dL — ABNORMAL LOW (ref 250–450)
UIBC: 148 ug/dL (ref 111–343)

## 2023-02-12 LAB — PHOSPHORUS: Phosphorus: 3.3 mg/dL (ref 2.8–4.1)

## 2023-02-20 ENCOUNTER — Other Ambulatory Visit: Payer: Self-pay | Admitting: Physician Assistant

## 2023-02-20 DIAGNOSIS — I10 Essential (primary) hypertension: Secondary | ICD-10-CM

## 2023-02-24 ENCOUNTER — Other Ambulatory Visit: Payer: Self-pay | Admitting: Physician Assistant

## 2023-02-24 MED ORDER — OXYCODONE HCL 15 MG PO TABS: 15.0000 mg | ORAL_TABLET | Freq: Three times a day (TID) | ORAL | 0 refills | Status: DC | PRN

## 2023-02-26 ENCOUNTER — Other Ambulatory Visit: Payer: Self-pay | Admitting: Physician Assistant

## 2023-02-26 ENCOUNTER — Other Ambulatory Visit (HOSPITAL_COMMUNITY): Payer: Self-pay

## 2023-02-26 ENCOUNTER — Other Ambulatory Visit: Payer: Self-pay

## 2023-02-26 MED ORDER — HYDROXYZINE HCL 50 MG PO TABS
50.0000 mg | ORAL_TABLET | Freq: Every evening | ORAL | 2 refills | Status: AC | PRN
  Filled 2023-02-26: qty 30, 30d supply, fill #0

## 2023-03-02 ENCOUNTER — Other Ambulatory Visit (HOSPITAL_COMMUNITY): Payer: Self-pay

## 2023-03-14 ENCOUNTER — Other Ambulatory Visit: Payer: Self-pay | Admitting: Physician Assistant

## 2023-03-14 DIAGNOSIS — G8921 Chronic pain due to trauma: Secondary | ICD-10-CM

## 2023-03-26 ENCOUNTER — Other Ambulatory Visit: Payer: Self-pay | Admitting: Physician Assistant

## 2023-03-27 MED ORDER — OXYCODONE HCL 15 MG PO TABS: 15.0000 mg | ORAL_TABLET | Freq: Three times a day (TID) | ORAL | 0 refills | Status: DC | PRN

## 2023-04-11 ENCOUNTER — Other Ambulatory Visit: Payer: Self-pay | Admitting: Physician Assistant

## 2023-04-11 DIAGNOSIS — G8921 Chronic pain due to trauma: Secondary | ICD-10-CM

## 2023-04-13 ENCOUNTER — Other Ambulatory Visit: Payer: Self-pay | Admitting: Cardiovascular Disease

## 2023-04-13 DIAGNOSIS — E782 Mixed hyperlipidemia: Secondary | ICD-10-CM

## 2023-04-21 ENCOUNTER — Other Ambulatory Visit: Payer: Self-pay | Admitting: Physician Assistant

## 2023-04-21 MED ORDER — AZITHROMYCIN 250 MG PO TABS: ORAL_TABLET | ORAL | 0 refills | Status: AC

## 2023-04-27 ENCOUNTER — Encounter (HOSPITAL_BASED_OUTPATIENT_CLINIC_OR_DEPARTMENT_OTHER): Payer: 59 | Attending: General Surgery | Admitting: General Surgery

## 2023-04-27 ENCOUNTER — Other Ambulatory Visit: Payer: Self-pay | Admitting: Physician Assistant

## 2023-04-27 DIAGNOSIS — T24322S Burn of third degree of left knee, sequela: Secondary | ICD-10-CM | POA: Insufficient documentation

## 2023-04-27 DIAGNOSIS — L97829 Non-pressure chronic ulcer of other part of left lower leg with unspecified severity: Secondary | ICD-10-CM | POA: Insufficient documentation

## 2023-04-27 DIAGNOSIS — T8189XA Other complications of procedures, not elsewhere classified, initial encounter: Secondary | ICD-10-CM | POA: Insufficient documentation

## 2023-04-27 DIAGNOSIS — Y839 Surgical procedure, unspecified as the cause of abnormal reaction of the patient, or of later complication, without mention of misadventure at the time of the procedure: Secondary | ICD-10-CM | POA: Insufficient documentation

## 2023-04-27 MED ORDER — OXYCODONE HCL 15 MG PO TABS: 15.0000 mg | ORAL_TABLET | Freq: Three times a day (TID) | ORAL | 0 refills | Status: DC | PRN

## 2023-05-05 ENCOUNTER — Encounter (HOSPITAL_BASED_OUTPATIENT_CLINIC_OR_DEPARTMENT_OTHER): Payer: 59 | Admitting: General Surgery

## 2023-05-05 DIAGNOSIS — T8189XA Other complications of procedures, not elsewhere classified, initial encounter: Secondary | ICD-10-CM | POA: Diagnosis not present

## 2023-05-11 ENCOUNTER — Ambulatory Visit (HOSPITAL_BASED_OUTPATIENT_CLINIC_OR_DEPARTMENT_OTHER): Payer: 59 | Admitting: General Surgery

## 2023-05-18 ENCOUNTER — Encounter (HOSPITAL_BASED_OUTPATIENT_CLINIC_OR_DEPARTMENT_OTHER): Payer: 59 | Attending: General Surgery | Admitting: General Surgery

## 2023-05-18 DIAGNOSIS — L97829 Non-pressure chronic ulcer of other part of left lower leg with unspecified severity: Secondary | ICD-10-CM | POA: Diagnosis not present

## 2023-05-18 DIAGNOSIS — T24322D Burn of third degree of left knee, subsequent encounter: Secondary | ICD-10-CM | POA: Diagnosis present

## 2023-05-22 ENCOUNTER — Encounter: Payer: Self-pay | Admitting: Physician Assistant

## 2023-05-26 ENCOUNTER — Other Ambulatory Visit: Payer: Self-pay | Admitting: Physician Assistant

## 2023-05-26 MED ORDER — OXYCODONE HCL 15 MG PO TABS: 15.0000 mg | ORAL_TABLET | Freq: Three times a day (TID) | ORAL | 0 refills | Status: DC | PRN

## 2023-06-01 ENCOUNTER — Encounter (HOSPITAL_BASED_OUTPATIENT_CLINIC_OR_DEPARTMENT_OTHER): Admitting: Internal Medicine

## 2023-06-01 DIAGNOSIS — T24322D Burn of third degree of left knee, subsequent encounter: Secondary | ICD-10-CM | POA: Diagnosis not present

## 2023-06-15 ENCOUNTER — Encounter (HOSPITAL_BASED_OUTPATIENT_CLINIC_OR_DEPARTMENT_OTHER): Admitting: General Surgery

## 2023-06-15 DIAGNOSIS — T24322D Burn of third degree of left knee, subsequent encounter: Secondary | ICD-10-CM | POA: Diagnosis not present

## 2023-06-16 ENCOUNTER — Encounter: Payer: Self-pay | Admitting: Physician Assistant

## 2023-06-16 ENCOUNTER — Ambulatory Visit (INDEPENDENT_AMBULATORY_CARE_PROVIDER_SITE_OTHER): Payer: 59 | Admitting: Physician Assistant

## 2023-06-16 VITALS — BP 130/70 | HR 86 | Temp 98.3°F | Resp 16 | Ht 72.0 in | Wt 278.4 lb

## 2023-06-16 DIAGNOSIS — E782 Mixed hyperlipidemia: Secondary | ICD-10-CM | POA: Diagnosis not present

## 2023-06-16 DIAGNOSIS — I4891 Unspecified atrial fibrillation: Secondary | ICD-10-CM

## 2023-06-16 DIAGNOSIS — I480 Paroxysmal atrial fibrillation: Secondary | ICD-10-CM

## 2023-06-16 DIAGNOSIS — I10 Essential (primary) hypertension: Secondary | ICD-10-CM

## 2023-06-16 DIAGNOSIS — R7989 Other specified abnormal findings of blood chemistry: Secondary | ICD-10-CM | POA: Diagnosis not present

## 2023-06-16 DIAGNOSIS — G8921 Chronic pain due to trauma: Secondary | ICD-10-CM

## 2023-06-16 MED ORDER — ROSUVASTATIN CALCIUM 10 MG PO TABS: 10.0000 mg | ORAL_TABLET | Freq: Every day | ORAL | 1 refills | Status: DC

## 2023-06-16 MED ORDER — ELIQUIS 5 MG PO TABS: 5.0000 mg | ORAL_TABLET | Freq: Two times a day (BID) | ORAL | 1 refills | Status: DC

## 2023-06-16 MED ORDER — HYDROCHLOROTHIAZIDE 25 MG PO TABS: 25.0000 mg | ORAL_TABLET | Freq: Every day | ORAL | 1 refills | Status: DC

## 2023-06-16 MED ORDER — DULOXETINE HCL 30 MG PO CPEP: 30.0000 mg | ORAL_CAPSULE | Freq: Two times a day (BID) | ORAL | 1 refills | Status: DC
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
## 2023-06-17 ENCOUNTER — Encounter: Payer: Self-pay | Admitting: Physician Assistant

## 2023-06-17 LAB — COMPREHENSIVE METABOLIC PANEL WITH GFR
ALT: 52 IU/L — ABNORMAL HIGH (ref 0–44)
AST: 32 IU/L (ref 0–40)
Albumin: 4.7 g/dL (ref 4.1–5.1)
Alkaline Phosphatase: 153 IU/L — ABNORMAL HIGH (ref 44–121)
BUN/Creatinine Ratio: 17 (ref 9–20)
BUN: 21 mg/dL — ABNORMAL HIGH (ref 6–20)
Bilirubin Total: 0.4 mg/dL (ref 0.0–1.2)
CO2: 23 mmol/L (ref 20–29)
Calcium: 9.6 mg/dL (ref 8.7–10.2)
Chloride: 100 mmol/L (ref 96–106)
Creatinine, Ser: 1.25 mg/dL (ref 0.76–1.27)
Globulin, Total: 2.6 g/dL (ref 1.5–4.5)
Glucose: 91 mg/dL (ref 70–99)
Potassium: 4.4 mmol/L (ref 3.5–5.2)
Sodium: 140 mmol/L (ref 134–144)
Total Protein: 7.3 g/dL (ref 6.0–8.5)
eGFR: 75 mL/min/{1.73_m2} (ref >59)

## 2023-06-17 LAB — CBC WITH DIFFERENTIAL/PLATELET
Basophils Absolute: 0 10*3/uL (ref 0.0–0.2)
Basos: 0 %
EOS (ABSOLUTE): 0.2 10*3/uL (ref 0.0–0.4)
Eos: 2 %
Hematocrit: 42.5 % (ref 37.5–51.0)
Hemoglobin: 14.3 g/dL (ref 13.0–17.7)
Immature Grans (Abs): 0 10*3/uL (ref 0.0–0.1)
Immature Granulocytes: 0 %
Lymphocytes Absolute: 1.8 10*3/uL (ref 0.7–3.1)
Lymphs: 24 %
MCH: 30.6 pg (ref 26.6–33.0)
MCHC: 33.6 g/dL (ref 31.5–35.7)
MCV: 91 fL (ref 79–97)
Monocytes Absolute: 0.6 10*3/uL (ref 0.1–0.9)
Monocytes: 7 %
Neutrophils Absolute: 5 10*3/uL (ref 1.4–7.0)
Neutrophils: 67 %
Platelets: 224 10*3/uL (ref 150–450)
RBC: 4.67 x10E6/uL (ref 4.14–5.80)
RDW: 14.2 % (ref 11.6–15.4)
WBC: 7.5 10*3/uL (ref 3.4–10.8)

## 2023-06-17 LAB — LIPID PANEL
Chol/HDL Ratio: 4.9 ratio (ref 0.0–5.0)
Cholesterol, Total: 148 mg/dL (ref 100–199)
HDL: 30 mg/dL — ABNORMAL LOW (ref >39)
LDL Chol Calc (NIH): 82 mg/dL (ref 0–99)
Triglycerides: 214 mg/dL — ABNORMAL HIGH (ref 0–149)
VLDL Cholesterol Cal: 36 mg/dL (ref 5–40)

## 2023-06-17 LAB — IRON,TIBC AND FERRITIN PANEL
Ferritin: 725 ng/mL — ABNORMAL HIGH (ref 30–400)
Iron Saturation: 19 % (ref 15–55)
Iron: 51 ug/dL (ref 38–169)
Total Iron Binding Capacity: 264 ug/dL (ref 250–450)
UIBC: 213 ug/dL (ref 111–343)

## 2023-06-17 LAB — TSH: TSH: 2.6 u[IU]/mL (ref 0.450–4.500)

## 2023-06-28 ENCOUNTER — Other Ambulatory Visit: Payer: Self-pay | Admitting: Physician Assistant

## 2023-06-30 ENCOUNTER — Other Ambulatory Visit: Payer: Self-pay | Admitting: Physician Assistant

## 2023-06-30 ENCOUNTER — Telehealth: Payer: Self-pay

## 2023-06-30 MED ORDER — OXYCODONE HCL 15 MG PO TABS: 15.0000 mg | ORAL_TABLET | Freq: Three times a day (TID) | ORAL | 0 refills | Status: DC | PRN

## 2023-06-30 NOTE — Telephone Encounter (Signed)
 Copied from CRM [id number]. Topic: Clinical - Prescription Issue >> Jun 30, 2023 10:32 AM Lotus Round B wrote: Reason for CRM: pt called in to see if he can get a refill on his oxyCODONE (ROXICODONE) 15 MG immediate release tablet since it would run out today . If there is anyway a nurse can give him a call to see if that is possible to do.

## 2023-06-30 NOTE — Telephone Encounter (Signed)
 Copied from CRM [id number]. Topic: Clinical - Medication Refill >> Jun 30, 2023 10:06 AM Georgeann Kindred wrote: Most Recent Primary Care Visit:  Provider: Cyndi Drain  Department: [facility]  Visit Type: OFFICE VISIT  Date: 06/16/2023  Medication: oxyCODONE (ROXICODONE) 15 MG immediate release tablet  Has the patient contacted their pharmacy? Yes (Agent: If no, request that the patient contact the pharmacy for the refill. If patient does not wish to contact the pharmacy document the reason why and proceed with request.) (Agent: If yes, when and what did the pharmacy advise?) Pharmacy has not gotten a response   Is this the correct pharmacy for this prescription? Yes If no, delete pharmacy and type the correct one.  This is the patient's preferred pharmacy:  [id number] - [place], [place] - [address] [place] [address] Phone: [phone] Fax: [phone]   Has the prescription been filled recently? No  Is the patient out of the medication? Yes  Has the patient been seen for an appointment in the last year OR does the patient have an upcoming appointment? Yes  Can we respond through MyChart? Yes  Agent: Please be advised that Rx refills may take up to 3 business days. We ask that you follow-up with your pharmacy.

## 2023-06-30 NOTE — Telephone Encounter (Signed)
 Spoke with patient's wife, verbalized understanding and had no questions at this time.

## 2023-06-30 NOTE — Telephone Encounter (Signed)
 Yes can refill ---- he needs to call a few days prior to needing med  This is the first request I have gotten for refill

## 2023-07-02 ENCOUNTER — Other Ambulatory Visit: Payer: Self-pay | Admitting: Family Medicine

## 2023-07-02 DIAGNOSIS — G8921 Chronic pain due to trauma: Secondary | ICD-10-CM

## 2023-07-05 ENCOUNTER — Other Ambulatory Visit: Payer: Self-pay | Admitting: Physician Assistant

## 2023-07-05 DIAGNOSIS — G8921 Chronic pain due to trauma: Secondary | ICD-10-CM

## 2023-07-13 ENCOUNTER — Ambulatory Visit (HOSPITAL_BASED_OUTPATIENT_CLINIC_OR_DEPARTMENT_OTHER): Admitting: General Surgery

## 2023-07-13 ENCOUNTER — Other Ambulatory Visit: Payer: Self-pay | Admitting: Physician Assistant

## 2023-07-13 DIAGNOSIS — G8921 Chronic pain due to trauma: Secondary | ICD-10-CM

## 2023-07-21 ENCOUNTER — Encounter (HOSPITAL_BASED_OUTPATIENT_CLINIC_OR_DEPARTMENT_OTHER): Attending: General Surgery | Admitting: General Surgery

## 2023-07-21 DIAGNOSIS — L97829 Non-pressure chronic ulcer of other part of left lower leg with unspecified severity: Secondary | ICD-10-CM | POA: Insufficient documentation

## 2023-07-24 ENCOUNTER — Other Ambulatory Visit: Payer: Self-pay | Admitting: Physician Assistant

## 2023-07-24 MED ORDER — OXYCODONE HCL 15 MG PO TABS: 15.0000 mg | ORAL_TABLET | Freq: Three times a day (TID) | ORAL | 0 refills | Status: DC | PRN

## 2023-08-26 ENCOUNTER — Encounter: Payer: Self-pay | Admitting: Physician Assistant

## 2023-08-26 ENCOUNTER — Other Ambulatory Visit: Payer: Self-pay

## 2023-08-26 DIAGNOSIS — T148XXA Other injury of unspecified body region, initial encounter: Secondary | ICD-10-CM | POA: Insufficient documentation

## 2023-08-26 MED ORDER — OXYCODONE HCL 15 MG PO TABS: 15.0000 mg | ORAL_TABLET | Freq: Three times a day (TID) | ORAL | 0 refills | Status: DC | PRN

## 2023-09-21 ENCOUNTER — Ambulatory Visit (HOSPITAL_BASED_OUTPATIENT_CLINIC_OR_DEPARTMENT_OTHER): Admitting: General Surgery

## 2023-09-23 ENCOUNTER — Encounter: Payer: Self-pay | Admitting: Physician Assistant

## 2023-09-23 ENCOUNTER — Other Ambulatory Visit: Payer: Self-pay | Admitting: Physician Assistant

## 2023-09-23 MED ORDER — OXYCODONE HCL 15 MG PO TABS: 15.0000 mg | ORAL_TABLET | Freq: Three times a day (TID) | ORAL | 0 refills | Status: DC | PRN

## 2023-09-25 ENCOUNTER — Encounter: Payer: Self-pay | Admitting: Physician Assistant

## 2023-09-26 ENCOUNTER — Other Ambulatory Visit: Payer: Self-pay | Admitting: Physician Assistant

## 2023-09-26 DIAGNOSIS — G8921 Chronic pain due to trauma: Secondary | ICD-10-CM

## 2023-10-22 ENCOUNTER — Encounter: Payer: Self-pay | Admitting: Physician Assistant

## 2023-10-22 ENCOUNTER — Ambulatory Visit (INDEPENDENT_AMBULATORY_CARE_PROVIDER_SITE_OTHER): Admitting: Physician Assistant

## 2023-10-22 VITALS — BP 138/88 | HR 70 | Temp 97.4°F | Ht 72.0 in | Wt 279.6 lb

## 2023-10-22 DIAGNOSIS — G8921 Chronic pain due to trauma: Secondary | ICD-10-CM | POA: Diagnosis not present

## 2023-10-22 DIAGNOSIS — R1013 Epigastric pain: Secondary | ICD-10-CM | POA: Insufficient documentation

## 2023-10-22 DIAGNOSIS — K580 Irritable bowel syndrome with diarrhea: Secondary | ICD-10-CM | POA: Insufficient documentation

## 2023-10-22 DIAGNOSIS — I10 Essential (primary) hypertension: Secondary | ICD-10-CM | POA: Diagnosis not present

## 2023-10-22 DIAGNOSIS — K625 Hemorrhage of anus and rectum: Secondary | ICD-10-CM | POA: Insufficient documentation

## 2023-10-22 DIAGNOSIS — R7989 Other specified abnormal findings of blood chemistry: Secondary | ICD-10-CM | POA: Diagnosis not present

## 2023-10-22 DIAGNOSIS — I4891 Unspecified atrial fibrillation: Secondary | ICD-10-CM | POA: Insufficient documentation

## 2023-10-22 DIAGNOSIS — T300 Burn of unspecified body region, unspecified degree: Secondary | ICD-10-CM

## 2023-10-22 DIAGNOSIS — K219 Gastro-esophageal reflux disease without esophagitis: Secondary | ICD-10-CM | POA: Insufficient documentation

## 2023-10-22 DIAGNOSIS — E782 Mixed hyperlipidemia: Secondary | ICD-10-CM | POA: Diagnosis not present

## 2023-10-22 DIAGNOSIS — S81802A Unspecified open wound, left lower leg, initial encounter: Secondary | ICD-10-CM | POA: Insufficient documentation

## 2023-10-22 DIAGNOSIS — Z87448 Personal history of other diseases of urinary system: Secondary | ICD-10-CM

## 2023-10-22 DIAGNOSIS — K601 Chronic anal fissure: Secondary | ICD-10-CM | POA: Insufficient documentation

## 2023-10-22 DIAGNOSIS — S81802S Unspecified open wound, left lower leg, sequela: Secondary | ICD-10-CM

## 2023-10-22 DIAGNOSIS — R110 Nausea: Secondary | ICD-10-CM | POA: Insufficient documentation

## 2023-10-22 DIAGNOSIS — K7581 Nonalcoholic steatohepatitis (NASH): Secondary | ICD-10-CM | POA: Insufficient documentation

## 2023-10-22 MED ORDER — OXYCODONE HCL 15 MG PO TABS: 15.0000 mg | ORAL_TABLET | Freq: Three times a day (TID) | ORAL | 0 refills | Status: DC | PRN
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
## 2023-10-23 ENCOUNTER — Ambulatory Visit: Payer: Self-pay | Admitting: Physician Assistant

## 2023-10-23 LAB — COMPREHENSIVE METABOLIC PANEL WITH GFR
ALT: 44 IU/L (ref 0–44)
AST: 27 IU/L (ref 0–40)
Albumin: 4.6 g/dL (ref 4.1–5.1)
Alkaline Phosphatase: 123 IU/L — ABNORMAL HIGH (ref 44–121)
BUN/Creatinine Ratio: 15 (ref 9–20)
BUN: 18 mg/dL (ref 6–24)
Bilirubin Total: 0.4 mg/dL (ref 0.0–1.2)
CO2: 23 mmol/L (ref 20–29)
Calcium: 9.3 mg/dL (ref 8.7–10.2)
Chloride: 101 mmol/L (ref 96–106)
Creatinine, Ser: 1.19 mg/dL (ref 0.76–1.27)
Globulin, Total: 2.3 g/dL (ref 1.5–4.5)
Glucose: 89 mg/dL (ref 70–99)
Potassium: 4.1 mmol/L (ref 3.5–5.2)
Sodium: 142 mmol/L (ref 134–144)
Total Protein: 6.9 g/dL (ref 6.0–8.5)
eGFR: 79 mL/min/1.73 (ref >59)

## 2023-10-23 LAB — CBC WITH DIFFERENTIAL/PLATELET
Basophils Absolute: 0 x10E3/uL (ref 0.0–0.2)
Basos: 1 %
EOS (ABSOLUTE): 0.2 x10E3/uL (ref 0.0–0.4)
Eos: 3 %
Hematocrit: 40.2 % (ref 37.5–51.0)
Hemoglobin: 13 g/dL (ref 13.0–17.7)
Immature Grans (Abs): 0 x10E3/uL (ref 0.0–0.1)
Immature Granulocytes: 0 %
Lymphocytes Absolute: 2 x10E3/uL (ref 0.7–3.1)
Lymphs: 30 %
MCH: 29.9 pg (ref 26.6–33.0)
MCHC: 32.3 g/dL (ref 31.5–35.7)
MCV: 92 fL (ref 79–97)
Monocytes Absolute: 0.5 x10E3/uL (ref 0.1–0.9)
Monocytes: 8 %
Neutrophils Absolute: 3.9 x10E3/uL (ref 1.4–7.0)
Neutrophils: 58 %
Platelets: 222 x10E3/uL (ref 150–450)
RBC: 4.35 x10E6/uL (ref 4.14–5.80)
RDW: 14.4 % (ref 11.6–15.4)
WBC: 6.6 x10E3/uL (ref 3.4–10.8)

## 2023-10-23 LAB — LIPID PANEL
Chol/HDL Ratio: 5.3 ratio — ABNORMAL HIGH (ref 0.0–5.0)
Cholesterol, Total: 149 mg/dL (ref 100–199)
HDL: 28 mg/dL — ABNORMAL LOW (ref >39)
LDL Chol Calc (NIH): 86 mg/dL (ref 0–99)
Triglycerides: 205 mg/dL — ABNORMAL HIGH (ref 0–149)
VLDL Cholesterol Cal: 35 mg/dL (ref 5–40)

## 2023-10-27 LAB — IRON AND TIBC
Iron Saturation: 29 % (ref 15–55)
Iron: 68 ug/dL (ref 38–169)
Total Iron Binding Capacity: 238 ug/dL — ABNORMAL LOW (ref 250–450)
UIBC: 170 ug/dL (ref 111–343)

## 2023-10-27 LAB — FERRITIN: Ferritin: 672 ng/mL — ABNORMAL HIGH (ref 30–400)

## 2023-10-27 LAB — SPECIMEN STATUS REPORT

## 2023-11-09 ENCOUNTER — Other Ambulatory Visit: Payer: Self-pay | Admitting: Physician Assistant

## 2023-11-09 DIAGNOSIS — G8921 Chronic pain due to trauma: Secondary | ICD-10-CM

## 2023-11-23 ENCOUNTER — Encounter: Payer: Self-pay | Admitting: Physician Assistant

## 2023-11-23 ENCOUNTER — Other Ambulatory Visit: Payer: Self-pay

## 2023-11-23 DIAGNOSIS — G8921 Chronic pain due to trauma: Secondary | ICD-10-CM

## 2023-11-23 MED ORDER — OXYCODONE HCL 15 MG PO TABS: 15.0000 mg | ORAL_TABLET | Freq: Three times a day (TID) | ORAL | 0 refills | Status: DC | PRN

## 2023-11-30 ENCOUNTER — Other Ambulatory Visit: Payer: Self-pay

## 2023-11-30 DIAGNOSIS — G8921 Chronic pain due to trauma: Secondary | ICD-10-CM

## 2023-11-30 MED ORDER — BUPRENORPHINE 20 MCG/HR TD PTWK: 1.0000 | MEDICATED_PATCH | TRANSDERMAL | 2 refills | Status: DC

## 2023-12-05 ENCOUNTER — Other Ambulatory Visit: Payer: Self-pay | Admitting: Physician Assistant

## 2023-12-21 ENCOUNTER — Encounter: Payer: Self-pay | Admitting: Physician Assistant

## 2023-12-22 ENCOUNTER — Other Ambulatory Visit: Payer: Self-pay

## 2023-12-22 DIAGNOSIS — G8921 Chronic pain due to trauma: Secondary | ICD-10-CM

## 2023-12-22 MED ORDER — OXYCODONE HCL 15 MG PO TABS: 15.0000 mg | ORAL_TABLET | Freq: Three times a day (TID) | ORAL | 0 refills | Status: DC | PRN

## 2023-12-28 ENCOUNTER — Other Ambulatory Visit: Payer: Self-pay | Admitting: Physician Assistant

## 2023-12-28 DIAGNOSIS — G8921 Chronic pain due to trauma: Secondary | ICD-10-CM

## 2023-12-28 DIAGNOSIS — E782 Mixed hyperlipidemia: Secondary | ICD-10-CM

## 2023-12-30 ENCOUNTER — Other Ambulatory Visit: Payer: Self-pay | Admitting: Physician Assistant

## 2023-12-30 DIAGNOSIS — I480 Paroxysmal atrial fibrillation: Secondary | ICD-10-CM

## 2023-12-30 DIAGNOSIS — E782 Mixed hyperlipidemia: Secondary | ICD-10-CM

## 2024-01-19 ENCOUNTER — Encounter: Payer: Self-pay | Admitting: Physician Assistant

## 2024-01-20 ENCOUNTER — Other Ambulatory Visit: Payer: Self-pay

## 2024-01-20 DIAGNOSIS — G8921 Chronic pain due to trauma: Secondary | ICD-10-CM

## 2024-01-20 MED ORDER — OXYCODONE HCL 15 MG PO TABS: 15.0000 mg | ORAL_TABLET | Freq: Three times a day (TID) | ORAL | 0 refills | Status: DC | PRN

## 2024-01-30 ENCOUNTER — Other Ambulatory Visit: Payer: Self-pay | Admitting: Physician Assistant

## 2024-01-30 DIAGNOSIS — I10 Essential (primary) hypertension: Secondary | ICD-10-CM

## 2024-02-13 ENCOUNTER — Other Ambulatory Visit: Payer: Self-pay | Admitting: Physician Assistant

## 2024-02-13 DIAGNOSIS — G8921 Chronic pain due to trauma: Secondary | ICD-10-CM

## 2024-02-17 ENCOUNTER — Encounter: Payer: Self-pay | Admitting: Physician Assistant

## 2024-02-17 ENCOUNTER — Other Ambulatory Visit: Payer: Self-pay | Admitting: Physician Assistant

## 2024-02-17 DIAGNOSIS — G8921 Chronic pain due to trauma: Secondary | ICD-10-CM

## 2024-02-17 MED ORDER — OXYCODONE HCL 15 MG PO TABS: 15.0000 mg | ORAL_TABLET | Freq: Three times a day (TID) | ORAL | 0 refills | Status: DC | PRN

## 2024-02-25 ENCOUNTER — Encounter: Payer: Self-pay | Admitting: Physician Assistant

## 2024-02-25 ENCOUNTER — Ambulatory Visit (INDEPENDENT_AMBULATORY_CARE_PROVIDER_SITE_OTHER): Admitting: Physician Assistant

## 2024-02-25 VITALS — BP 118/80 | HR 85 | Temp 97.3°F | Resp 16 | Ht 72.0 in | Wt 288.0 lb

## 2024-02-25 DIAGNOSIS — Z87448 Personal history of other diseases of urinary system: Secondary | ICD-10-CM | POA: Diagnosis not present

## 2024-02-25 DIAGNOSIS — E782 Mixed hyperlipidemia: Secondary | ICD-10-CM

## 2024-02-25 DIAGNOSIS — I4891 Unspecified atrial fibrillation: Secondary | ICD-10-CM

## 2024-02-25 DIAGNOSIS — I10 Essential (primary) hypertension: Secondary | ICD-10-CM | POA: Diagnosis not present

## 2024-02-25 DIAGNOSIS — G8921 Chronic pain due to trauma: Secondary | ICD-10-CM

## 2024-02-25 DIAGNOSIS — R7989 Other specified abnormal findings of blood chemistry: Secondary | ICD-10-CM | POA: Diagnosis not present

## 2024-02-25 LAB — COMPREHENSIVE METABOLIC PANEL WITH GFR
ALT: 64 IU/L — ABNORMAL HIGH (ref 0–44)
AST: 37 IU/L (ref 0–40)
Albumin: 4.8 g/dL (ref 4.1–5.1)
Alkaline Phosphatase: 123 IU/L (ref 47–123)
BUN/Creatinine Ratio: 18 (ref 9–20)
BUN: 24 mg/dL (ref 6–24)
Bilirubin Total: 0.6 mg/dL (ref 0.0–1.2)
CO2: 26 mmol/L (ref 20–29)
Calcium: 9.5 mg/dL (ref 8.7–10.2)
Chloride: 102 mmol/L (ref 96–106)
Creatinine, Ser: 1.36 mg/dL — ABNORMAL HIGH (ref 0.76–1.27)
Globulin, Total: 2.3 g/dL (ref 1.5–4.5)
Glucose: 98 mg/dL (ref 70–99)
Potassium: 4.4 mmol/L (ref 3.5–5.2)
Sodium: 144 mmol/L (ref 134–144)
Total Protein: 7.1 g/dL (ref 6.0–8.5)
eGFR: 67 mL/min/1.73 (ref >59)

## 2024-02-25 LAB — FE+CBC/D/PLT+TIBC+FER+RETIC
Basophils Absolute: 0 x10E3/uL (ref 0.0–0.2)
Basos: 1 %
EOS (ABSOLUTE): 0.2 x10E3/uL (ref 0.0–0.4)
Eos: 3 %
Ferritin: 684 ng/mL — ABNORMAL HIGH (ref 30–400)
Hematocrit: 43.4 % (ref 37.5–51.0)
Hemoglobin: 13.8 g/dL (ref 13.0–17.7)
Immature Grans (Abs): 0 x10E3/uL (ref 0.0–0.1)
Immature Granulocytes: 0 %
Iron Saturation: 38 % (ref 15–55)
Iron: 101 ug/dL (ref 38–169)
Lymphocytes Absolute: 2 x10E3/uL (ref 0.7–3.1)
Lymphs: 32 %
MCH: 29.4 pg (ref 26.6–33.0)
MCHC: 31.8 g/dL (ref 31.5–35.7)
MCV: 93 fL (ref 79–97)
Monocytes Absolute: 0.5 x10E3/uL (ref 0.1–0.9)
Monocytes: 7 %
Neutrophils Absolute: 3.6 x10E3/uL (ref 1.4–7.0)
Neutrophils: 57 %
Platelets: 236 x10E3/uL (ref 150–450)
RBC: 4.69 x10E6/uL (ref 4.14–5.80)
RDW: 13.3 % (ref 11.6–15.4)
Retic Ct Pct: 1.6 % (ref 0.6–2.6)
Total Iron Binding Capacity: 264 ug/dL (ref 250–450)
UIBC: 163 ug/dL (ref 111–343)
WBC: 6.3 x10E3/uL (ref 3.4–10.8)

## 2024-02-25 LAB — LIPID PANEL
Chol/HDL Ratio: 4.7 ratio (ref 0.0–5.0)
Cholesterol, Total: 145 mg/dL (ref 100–199)
HDL: 31 mg/dL — ABNORMAL LOW (ref >39)
LDL Chol Calc (NIH): 79 mg/dL (ref 0–99)
Triglycerides: 206 mg/dL — ABNORMAL HIGH (ref 0–149)
VLDL Cholesterol Cal: 35 mg/dL (ref 5–40)
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
## 2024-03-01 ENCOUNTER — Ambulatory Visit: Payer: Self-pay | Admitting: Physician Assistant

## 2024-03-15 ENCOUNTER — Other Ambulatory Visit: Payer: Self-pay | Admitting: Physician Assistant

## 2024-03-15 DIAGNOSIS — G8921 Chronic pain due to trauma: Secondary | ICD-10-CM

## 2024-03-16 ENCOUNTER — Encounter: Payer: Self-pay | Admitting: Physician Assistant

## 2024-03-21 ENCOUNTER — Encounter: Payer: Self-pay | Admitting: Physician Assistant

## 2024-03-21 ENCOUNTER — Other Ambulatory Visit: Payer: Self-pay

## 2024-03-21 DIAGNOSIS — G8921 Chronic pain due to trauma: Secondary | ICD-10-CM

## 2024-03-21 MED ORDER — OXYCODONE HCL 15 MG PO TABS: 15.0000 mg | ORAL_TABLET | Freq: Three times a day (TID) | ORAL | 0 refills | Status: DC | PRN

## 2024-04-17 ENCOUNTER — Other Ambulatory Visit: Payer: Self-pay | Admitting: Physician Assistant

## 2024-04-17 DIAGNOSIS — G8921 Chronic pain due to trauma: Secondary | ICD-10-CM

## 2024-04-19 ENCOUNTER — Other Ambulatory Visit: Payer: Self-pay

## 2024-04-19 ENCOUNTER — Encounter: Payer: Self-pay | Admitting: Physician Assistant

## 2024-04-19 DIAGNOSIS — G8921 Chronic pain due to trauma: Secondary | ICD-10-CM

## 2024-04-19 MED ORDER — OXYCODONE HCL 15 MG PO TABS: 15.0000 mg | ORAL_TABLET | Freq: Three times a day (TID) | ORAL | 0 refills | Status: AC | PRN

## 2024-04-19 NOTE — Telephone Encounter (Signed)
 Refill request has been sent to provider with requested pharmacy.

## 2024-08-29 ENCOUNTER — Ambulatory Visit: Admitting: Physician Assistant
